# Patient Record
Sex: Male | Born: 1945 | Race: Black or African American | Hispanic: No | Marital: Married | Smoking: Never smoker
Health system: Southern US, Community
[De-identification: ages and names within clinical notes are randomized; demographics above are authoritative.]

## PROBLEM LIST (undated history)

## (undated) DIAGNOSIS — K219 Gastro-esophageal reflux disease without esophagitis: Secondary | ICD-10-CM

## (undated) DIAGNOSIS — I10 Essential (primary) hypertension: Secondary | ICD-10-CM

## (undated) DIAGNOSIS — H9319 Tinnitus, unspecified ear: Secondary | ICD-10-CM

## (undated) DIAGNOSIS — F0280 Dementia in other diseases classified elsewhere without behavioral disturbance: Secondary | ICD-10-CM

## (undated) DIAGNOSIS — E785 Hyperlipidemia, unspecified: Secondary | ICD-10-CM

---

## 2020-08-05 ENCOUNTER — Emergency Department (HOSPITAL_COMMUNITY): Payer: Medicare PPO

## 2020-08-05 ENCOUNTER — Other Ambulatory Visit: Payer: Self-pay

## 2020-08-05 ENCOUNTER — Inpatient Hospital Stay (HOSPITAL_COMMUNITY)
Admission: EM | Admit: 2020-08-05 | Discharge: 2020-08-15 | DRG: 380 | Disposition: A | Discharge status: Skilled Nursing Facility | Payer: Medicare PPO | Attending: Internal Medicine | Admitting: Internal Medicine

## 2020-08-05 ENCOUNTER — Inpatient Hospital Stay (HOSPITAL_COMMUNITY): Payer: Medicare PPO

## 2020-08-05 ENCOUNTER — Encounter (HOSPITAL_COMMUNITY): Payer: Self-pay | Admitting: *Deleted

## 2020-08-05 DIAGNOSIS — G9341 Metabolic encephalopathy: Secondary | ICD-10-CM | POA: Diagnosis not present

## 2020-08-05 DIAGNOSIS — F0280 Dementia in other diseases classified elsewhere without behavioral disturbance: Secondary | ICD-10-CM | POA: Diagnosis not present

## 2020-08-05 DIAGNOSIS — D892 Hypergammaglobulinemia, unspecified: Secondary | ICD-10-CM

## 2020-08-05 DIAGNOSIS — Z515 Encounter for palliative care: Secondary | ICD-10-CM | POA: Diagnosis not present

## 2020-08-05 DIAGNOSIS — R197 Diarrhea, unspecified: Secondary | ICD-10-CM | POA: Diagnosis not present

## 2020-08-05 DIAGNOSIS — N3001 Acute cystitis with hematuria: Secondary | ICD-10-CM | POA: Diagnosis present

## 2020-08-05 DIAGNOSIS — Z66 Do not resuscitate: Secondary | ICD-10-CM | POA: Diagnosis present

## 2020-08-05 DIAGNOSIS — I10 Essential (primary) hypertension: Secondary | ICD-10-CM

## 2020-08-05 DIAGNOSIS — K2100 Gastro-esophageal reflux disease with esophagitis, without bleeding: Secondary | ICD-10-CM | POA: Diagnosis present

## 2020-08-05 DIAGNOSIS — K209 Esophagitis, unspecified without bleeding: Secondary | ICD-10-CM | POA: Diagnosis not present

## 2020-08-05 DIAGNOSIS — Z87440 Personal history of urinary (tract) infections: Secondary | ICD-10-CM

## 2020-08-05 DIAGNOSIS — M4856XA Collapsed vertebra, not elsewhere classified, lumbar region, initial encounter for fracture: Secondary | ICD-10-CM | POA: Diagnosis present

## 2020-08-05 DIAGNOSIS — E870 Hyperosmolality and hypernatremia: Secondary | ICD-10-CM | POA: Diagnosis present

## 2020-08-05 DIAGNOSIS — Z886 Allergy status to analgesic agent status: Secondary | ICD-10-CM

## 2020-08-05 DIAGNOSIS — Z79899 Other long term (current) drug therapy: Secondary | ICD-10-CM | POA: Diagnosis not present

## 2020-08-05 DIAGNOSIS — K311 Adult hypertrophic pyloric stenosis: Secondary | ICD-10-CM | POA: Diagnosis present

## 2020-08-05 DIAGNOSIS — E7800 Pure hypercholesterolemia, unspecified: Secondary | ICD-10-CM | POA: Diagnosis present

## 2020-08-05 DIAGNOSIS — B9620 Unspecified Escherichia coli [E. coli] as the cause of diseases classified elsewhere: Secondary | ICD-10-CM | POA: Diagnosis present

## 2020-08-05 DIAGNOSIS — Z781 Physical restraint status: Secondary | ICD-10-CM | POA: Diagnosis not present

## 2020-08-05 DIAGNOSIS — Z20822 Contact with and (suspected) exposure to covid-19: Secondary | ICD-10-CM | POA: Diagnosis present

## 2020-08-05 DIAGNOSIS — R109 Unspecified abdominal pain: Secondary | ICD-10-CM | POA: Diagnosis present

## 2020-08-05 DIAGNOSIS — E876 Hypokalemia: Secondary | ICD-10-CM | POA: Diagnosis not present

## 2020-08-05 DIAGNOSIS — K219 Gastro-esophageal reflux disease without esophagitis: Secondary | ICD-10-CM

## 2020-08-05 DIAGNOSIS — F05 Delirium due to known physiological condition: Secondary | ICD-10-CM | POA: Diagnosis present

## 2020-08-05 DIAGNOSIS — E785 Hyperlipidemia, unspecified: Secondary | ICD-10-CM | POA: Diagnosis present

## 2020-08-05 DIAGNOSIS — Z91013 Allergy to seafood: Secondary | ICD-10-CM

## 2020-08-05 DIAGNOSIS — K2900 Acute gastritis without bleeding: Secondary | ICD-10-CM | POA: Diagnosis present

## 2020-08-05 DIAGNOSIS — R1110 Vomiting, unspecified: Secondary | ICD-10-CM

## 2020-08-05 DIAGNOSIS — I700 Atherosclerosis of aorta: Secondary | ICD-10-CM | POA: Diagnosis present

## 2020-08-05 DIAGNOSIS — Z0189 Encounter for other specified special examinations: Secondary | ICD-10-CM

## 2020-08-05 DIAGNOSIS — R0602 Shortness of breath: Secondary | ICD-10-CM

## 2020-08-05 DIAGNOSIS — G309 Alzheimer's disease, unspecified: Secondary | ICD-10-CM | POA: Diagnosis present

## 2020-08-05 DIAGNOSIS — R1310 Dysphagia, unspecified: Secondary | ICD-10-CM | POA: Diagnosis present

## 2020-08-05 DIAGNOSIS — Z4659 Encounter for fitting and adjustment of other gastrointestinal appliance and device: Secondary | ICD-10-CM

## 2020-08-05 HISTORY — DX: Hyperlipidemia, unspecified: E78.5

## 2020-08-05 HISTORY — DX: Tinnitus, unspecified ear: H93.19

## 2020-08-05 HISTORY — DX: Essential (primary) hypertension: I10

## 2020-08-05 HISTORY — DX: Dementia in other diseases classified elsewhere, unspecified severity, without behavioral disturbance, psychotic disturbance, mood disturbance, and anxiety: F02.80

## 2020-08-05 HISTORY — DX: Gastro-esophageal reflux disease without esophagitis: K21.9

## 2020-08-05 LAB — CBC
HCT: 39.5 % (ref 39.0–52.0)
Hemoglobin: 12.4 g/dL — ABNORMAL LOW (ref 13.0–17.0)
MCH: 25.3 pg — ABNORMAL LOW (ref 26.0–34.0)
MCHC: 31.4 g/dL (ref 30.0–36.0)
MCV: 80.6 fL (ref 80.0–100.0)
Platelets: 424 10*3/uL — ABNORMAL HIGH (ref 150–400)
RBC: 4.9 MIL/uL (ref 4.22–5.81)
RDW: 15 % (ref 11.5–15.5)
WBC: 11.5 10*3/uL — ABNORMAL HIGH (ref 4.0–10.5)
nRBC: 0 % (ref 0.0–0.2)

## 2020-08-05 LAB — URINALYSIS, ROUTINE W REFLEX MICROSCOPIC
Bilirubin Urine: NEGATIVE
Glucose, UA: NEGATIVE mg/dL
Ketones, ur: NEGATIVE mg/dL
Nitrite: NEGATIVE
Protein, ur: 30 mg/dL — AB
Specific Gravity, Urine: 1.029 (ref 1.005–1.030)
WBC, UA: >50 WBC/hpf — ABNORMAL HIGH (ref 0–5)
pH: 6 (ref 5.0–8.0)

## 2020-08-05 LAB — COMPREHENSIVE METABOLIC PANEL
ALT: 14 U/L (ref 0–44)
AST: 55 U/L — ABNORMAL HIGH (ref 15–41)
Albumin: 3.2 g/dL — ABNORMAL LOW (ref 3.5–5.0)
Alkaline Phosphatase: 88 U/L (ref 38–126)
Anion gap: 5 (ref 5–15)
BUN: 15 mg/dL (ref 8–23)
CO2: 34 mmol/L — ABNORMAL HIGH (ref 22–32)
Calcium: 9.6 mg/dL (ref 8.9–10.3)
Chloride: 107 mmol/L (ref 98–111)
Creatinine, Ser: 1 mg/dL (ref 0.61–1.24)
GFR, Estimated: >60 mL/min (ref >60)
Glucose, Bld: 131 mg/dL — ABNORMAL HIGH (ref 70–99)
Potassium: 5.1 mmol/L (ref 3.5–5.1)
Sodium: 146 mmol/L — ABNORMAL HIGH (ref 135–145)
Total Bilirubin: 1.7 mg/dL — ABNORMAL HIGH (ref 0.3–1.2)
Total Protein: >12 g/dL — ABNORMAL HIGH (ref 6.5–8.1)

## 2020-08-05 LAB — HEMOGLOBIN A1C
Hgb A1c MFr Bld: 6.6 % — ABNORMAL HIGH (ref 4.8–5.6)
Mean Plasma Glucose: 142.72 mg/dL

## 2020-08-05 LAB — LIPASE, BLOOD: Lipase: 40 U/L (ref 11–51)

## 2020-08-05 LAB — SARS CORONAVIRUS 2 (TAT 6-24 HRS): SARS Coronavirus 2: NEGATIVE

## 2020-08-05 MED ORDER — PANTOPRAZOLE SODIUM 40 MG IV SOLR
40.0000 mg | Freq: Two times a day (BID) | INTRAVENOUS | Status: DC
  Administered 2020-08-05 – 2020-08-06 (×3): 40 mg via INTRAVENOUS
  Filled 2020-08-05 (×2): qty 40

## 2020-08-05 MED ORDER — QUETIAPINE FUMARATE 50 MG PO TABS
50.0000 mg | ORAL_TABLET | Freq: Every day | ORAL | Status: DC
  Administered 2020-08-06: 50 mg via ORAL
  Filled 2020-08-05 (×3): qty 1

## 2020-08-05 MED ORDER — IOHEXOL 300 MG/ML  SOLN
100.0000 mL | Freq: Once | INTRAMUSCULAR | Status: AC | PRN
  Administered 2020-08-05: 100 mL via INTRAVENOUS

## 2020-08-05 MED ORDER — PANTOPRAZOLE SODIUM 40 MG IV SOLR
40.0000 mg | Freq: Once | INTRAVENOUS | Status: AC
  Administered 2020-08-05: 40 mg via INTRAVENOUS
  Filled 2020-08-05: qty 40

## 2020-08-05 MED ORDER — LORAZEPAM 2 MG/ML IJ SOLN
1.0000 mg | Freq: Once | INTRAMUSCULAR | Status: AC
  Administered 2020-08-05: 1 mg via INTRAVENOUS
  Filled 2020-08-05: qty 1

## 2020-08-05 MED ORDER — ACETAMINOPHEN 650 MG RE SUPP: 650.0000 mg | Freq: Four times a day (QID) | RECTAL | Status: DC | PRN

## 2020-08-05 MED ORDER — QUETIAPINE FUMARATE 25 MG PO TABS
37.5000 mg | ORAL_TABLET | Freq: Every day | ORAL | Status: DC
  Administered 2020-08-06 – 2020-08-07 (×2): 37.5 mg via ORAL
  Filled 2020-08-05 (×2): qty 1

## 2020-08-05 MED ORDER — ONDANSETRON HCL 4 MG PO TABS: 4.0000 mg | ORAL_TABLET | Freq: Four times a day (QID) | ORAL | Status: DC | PRN

## 2020-08-05 MED ORDER — ALBUTEROL SULFATE (2.5 MG/3ML) 0.083% IN NEBU: 2.5000 mg | INHALATION_SOLUTION | RESPIRATORY_TRACT | Status: DC | PRN

## 2020-08-05 MED ORDER — ONDANSETRON HCL 4 MG/2ML IJ SOLN: 4.0000 mg | Freq: Four times a day (QID) | INTRAMUSCULAR | Status: DC | PRN

## 2020-08-05 MED ORDER — ACETAMINOPHEN 325 MG PO TABS
650.0000 mg | ORAL_TABLET | Freq: Four times a day (QID) | ORAL | Status: DC | PRN
  Administered 2020-08-10: 650 mg via ORAL
  Filled 2020-08-05: qty 2

## 2020-08-05 MED ORDER — DEXTROSE-NACL 5-0.45 % IV SOLN: INTRAVENOUS | Status: DC

## 2020-08-05 MED ORDER — HALOPERIDOL LACTATE 5 MG/ML IJ SOLN
5.0000 mg | Freq: Once | INTRAMUSCULAR | Status: AC
  Administered 2020-08-05: 5 mg via INTRAVENOUS
  Filled 2020-08-05: qty 1

## 2020-08-05 MED ORDER — SODIUM CHLORIDE 0.9 % IV SOLN
1.0000 g | Freq: Once | INTRAVENOUS | Status: AC
  Administered 2020-08-05: 1 g via INTRAVENOUS
  Filled 2020-08-05: qty 10

## 2020-08-05 NOTE — ED Notes (Signed)
Again report called told "this patient is going to a different nurse you will need to call her."

## 2020-08-05 NOTE — ED Notes (Signed)
Patient transported to CT 

## 2020-08-05 NOTE — ED Notes (Addendum)
Report called to [facility]. Denied questions or concerns. Soft wrists restrained removed. Skin intact.

## 2020-08-05 NOTE — ED Notes (Signed)
Patient transported to X-ray 

## 2020-08-05 NOTE — H&P (Signed)
History and Physical  Patient Name: Blake Lutz     YJE:[id number]    DOB: 1945-08-28    DOA: 08/05/2020 PCP: Default, Provider, MD  Patient coming from: Nursing facility  Chief Complaint: Diarrhea    HPI: Blake Lutz is a 75 y.o. male, with PMH of Alzheimer's, GERD, hypertension, dyslipidemia, compression fracture, prostate cancer who presented to the ER on 08/05/2020 with diarrhea.  Due to patient's baseline Alzheimer's, dementia, unable to obtain complete and thorough HPI review of systems.  History obtained from chart and family.  Apparently since yesterday, patient has had multiple episodes of nonbloody diarrhea.  He also has had some nausea and vomiting.  His symptoms have been persistent.  Only GI history per wife is GERD.  He has only been at [facility], nursing facility, for 1 week.  He has a history of Alzheimer's and is nonverbal at baseline.  Paperwork from facility did not have any home medications.  Wife states he has a history of UTIs recently.  He was treated for UTI about a week ago and received 3 days of antibiotics.  Wife currently does not live in the area but is in the process of moving to the area. Due to his symptoms at the facility, he was transferred to the ER for evaluation.    ED course: -Vitals on admission: Afebrile, heart rate 93, respiratory rate 15, blood pressure 132/78, maintaining sats on room air. -Labs on initial presentation: Sodium 146, potassium 5.1, chloride 107, bicarb 34, glucose 133, BUN 15, creatinine 1, calcium 9.6, albumin 3.2, lipase 40, WBC 11.5, hemoglobin 12.4, platelets 424 -Imaging obtained on admission: Personally reviewed imaging which shows: CT abdomen pelvis demonstrated gastric outlet obstruction, moderately to severely dilated stomach with abrupt tapering and proximal duodenum, findings consistent with diarrhea, and diffusely thickened bladder wall thickening. -In the ED the patient was given Rocephin, Protonix.  GI consulted in  ED and the hospitalist service was contacted for further evaluation and management.     ROS: Unable to obtain due to patient's status.     Past Medical History:  Diagnosis Date  . Alzheimer disease (HCC)   . GERD (gastroesophageal reflux disease)   . Hyperlipidemia   . Hypertension   . Tinnitus       Social History: Patient lives at nursing facility.  Unsure ambulatory status.  Non-smoker  Allergies  Allergen Reactions  . Ibuprofen   . Shellfish Allergy     Family history: family history is not on file.  Prior to Admission medications   Not on File       Physical Exam: BP 138/85   Pulse 86   Temp 98.3 F (36.8 C) (Oral)   Resp 11   SpO2 97%   General appearance: Well-developed, adult male, alert and in no acute distress .   Eyes: Anicteric, conjunctiva pink, lids and lashes normal. PERRL.    ENT: No nasal deformity, discharge, epistaxis.  OP moist without lesions.   Neck: No neck masses.  Trachea midline.  No thyromegaly/tenderness. Lymph: No cervical or supraclavicular lymphadenopathy. Skin: Warm and dry.  No jaundice.  No suspicious rashes or lesions. Cardiac: RRR, nl S1-S2, no murmurs appreciated.  No LE edema.  Radial and pedal pulses 2+ and symmetric. Respiratory: Normal respiratory rate and rhythm.  CTAB without rales or wheezes. Abdomen: Abdomen soft.    Diffuse tenderness.   Bowel sounds present. MSK: No deformities or effusions of the large joints of the upper or lower extremities bilaterally.  No cyanosis  or clubbing. Neuro:  Unable to fully assess due to patient status.  Sensation intact to light touch. Non verbal Psych:  Unable to converse with.  Nonverbal.    Labs on Admission:  I have personally reviewed following labs and imaging studies: CBC: Recent Labs  Lab 08/05/20 0641  WBC 11.5*  HGB 12.4*  HCT 39.5  MCV 80.6  PLT 424*   Basic Metabolic Panel: Recent Labs  Lab 08/05/20 0641  NA 146*  K 5.1  CL 107  CO2 34*  GLUCOSE  131*  BUN 15  CREATININE 1.00  CALCIUM 9.6   GFR: CrCl cannot be calculated (Unknown ideal weight.).  Liver Function Tests: Recent Labs  Lab 08/05/20 0641  AST 55*  ALT 14  ALKPHOS 88  BILITOT 1.7*  PROT >12.0*  ALBUMIN 3.2*   Recent Labs  Lab 08/05/20 0641  LIPASE 40   No results for input(s): AMMONIA in the last 168 hours. Coagulation Profile: No results for input(s): INR, PROTIME in the last 168 hours. Cardiac Enzymes: No results for input(s): CKTOTAL, CKMB, CKMBINDEX, TROPONINI in the last 168 hours. BNP (last 3 results) No results for input(s): PROBNP in the last 8760 hours. HbA1C: No results for input(s): HGBA1C in the last 72 hours. CBG: No results for input(s): GLUCAP in the last 168 hours. Lipid Profile: No results for input(s): CHOL, HDL, LDLCALC, TRIG, CHOLHDL, LDLDIRECT in the last 72 hours. Thyroid Function Tests: No results for input(s): TSH, T4TOTAL, FREET4, T3FREE, THYROIDAB in the last 72 hours. Anemia Panel: No results for input(s): VITAMINB12, FOLATE, FERRITIN, TIBC, IRON, RETICCTPCT in the last 72 hours.   No results found for this or any previous visit (from the past 240 hour(s)).         Radiological Exams on Admission: Personally reviewed imaging which shows: CT abdomen pelvis demonstrated gastric outlet obstruction, moderately to severely dilated stomach with abrupt tapering and proximal duodenum, findings consistent with diarrhea, and diffusely thickened bladder wall thickening.  CT Abdomen Pelvis W Contrast  Result Date: 08/05/2020 CLINICAL DATA:  75 year old male with abdominal distension and pain. EXAM: CT ABDOMEN AND PELVIS WITH CONTRAST TECHNIQUE: Multidetector CT imaging of the abdomen and pelvis was performed using the standard protocol following bolus administration of intravenous contrast. CONTRAST:  OMNIPAQUE IOHEXOL 300 MG/ML  SOLN COMPARISON:  None. FINDINGS: Lower chest: Mild motion artifact and atelectasis. Mild  cardiomegaly. No pericardial effusion. Calcified coronary artery atherosclerosis. Hepatobiliary: Contracted gallbladder. Negative liver. No bile duct enlargement. Pancreas: Partially atrophied, otherwise negative. Spleen: Diminutive and negative; there is a tiny low-density area in the mid body of the spleen on series 4, image 28 which is too small to characterize but likely benign. Adrenals/Urinary Tract: Normal adrenal glands. Symmetric renal enhancement and contrast excretion. Benign appearing right renal posterior midpole cyst. Decompressed ureters. No urinary calculus identified. Diffusely thick-walled urinary bladder (series 4, image 81). No perivesical stranding. Bladder volume within normal limits. Stomach/Bowel: Fluid throughout nondilated large bowel to the rectum. Redundant hepatic flexure and right colon. Motion artifact in the upper abdomen. No discrete large bowel inflammation. Normal appendix on series 4, image 59. Decompressed terminal ileum with fluid-filled but nondilated distal small bowel in the lower abdomen. Decompressed proximal small bowel. Moderately to severely dilated gas and fluid containing stomach. Duodenal bulb similarly dilated with abrupt tapering at the distal bulb, proximal duodenum on coronal image 43. Questionable duodenal wall thickening there, but no perigastric or para duodenal inflammation. Distal duodenum and ligament of Treitz decompressed. No free  air.  No free fluid. Vascular/Lymphatic: Aortoiliac calcified atherosclerosis. Tortuous major arteries in the abdomen and pelvis which appear to remain patent. Portal venous system is patent. No lymphadenopathy. Reproductive: Sequelae of prostate brachytherapy. Other: No pelvic free fluid. Musculoskeletal: Chronic appearing L1 superior endplate compression fracture with associated sclerosis. Disc and endplate degeneration elsewhere. No acute osseous abnormality identified. IMPRESSION: 1. Gastric Outlet Obstruction: Moderately to  severely dilated stomach containing gas and fluid with abrupt tapering in the proximal duodenum. Questionable duodenal wall thickening, such that this might be related to inflammatory or postinflammatory stricture. But there is no regional inflammation. Recommend NG tube decompression. 2. Superimposed fluid throughout nondilated large bowel compatible with diarrhea. No discrete large bowel inflammation. Normal appendix. 3. Diffusely thick-walled urinary bladder suggesting cystitis or UTI. Nearby sequelae of prostate brachytherapy. 4. Chronic appearing L1 superior endplate compression fracture. 5. Aortic Atherosclerosis (ICD10-I70.0). Mild cardiomegaly. Coronary artery atherosclerosis. Electronically Signed   By: Odessa Fleming M.D.   On: 08/05/2020 10:17        Assessment/Plan   1.  Gastric outlet obstruction -CT abdomen pelvis with contrast on admission demonstrated gastric outlet obstruction, moderately to severely dilated stomach with abrupt tapering in the proximal duodenum -GI consulted in the ED -N.p.o.  -NG tube pending -Continue PPI Q12H -Antiemetics as warranted -Aspiration precautions -IV fluids -Strict I's and O's  2.  Diarrhea -Patient has had diarrhea for the past 24 hours.  History of recent antibiotic use -CT abdomen pelvis on admission demonstrated findings consistent with diarrhea -C. difficile testing ordered -IV fluids, 1/2 NS with D5W given slight hyponatremia  3.  Alzheimer's dementia -Mittens in place to avoid removal of lines and NG tube -Fall precautions -Currently n.p.o. due to gastric outlet obstruction  4.  Hypernatremia -On admission sodium 146 -Likely secondary to impaired free water intake and ongoing GI losses -Started on half-normal saline -Follow-up labs ordered  5.  Paraproteinemia -On admission total protein >12.  Unfortunately no previous labs to compare to -We will defer further work-up at this time    DVT prophylaxis: SCDs Code Status:  DNR Family Communication: Wife and daughter via telephone Disposition Plan: Anticipate discharge to nursing facility when medically optimized Consults called: GI consulted by ED Admission status: Inpatient     Medical decision making: Patient seen at 12:23 PM on 08/05/2020.  The patient was discussed with ER provider.  What exists of the patient's chart was reviewed in depth and summarized above.  Clinical condition: Watcher.        Laqueta Due [facility] Please page though AMION or Epic secure chat:  For password, contact charge nurse

## 2020-08-05 NOTE — ED Notes (Signed)
RN told in report that report has neem atte,pted since 1600 with no success attempting report.

## 2020-08-05 NOTE — ED Notes (Signed)
RN assuming care. Pt Pt resting in bed @ this time w/ NAD noted. VSS. Cardiac monitoring in place w/ bp cycling and spo2 being monitored. Remains in soft restraints. NG tube secured to R nostril.   Updated on plan of care. Deny needs or concerns @ this time. Bed low and locked.

## 2020-08-05 NOTE — Plan of Care (Signed)
  Problem: Safety: Goal: Non-violent Restraint(s) Outcome: Not Progressing  Pt trying to pull cortrak

## 2020-08-05 NOTE — Consult Note (Addendum)
Referring Provider:  EDP Primary Care Physician:  Default, Provider, MD Primary Gastroenterologist:  Gentry Fitz  Reason for Consultation:  GOO with duodenal wall thickening on CT  HPI: Blake Lutz is a 75 y.o. male with past medical history of advanced dementia, GERD, hypertension, hyperlipidemia.  He was brought in from nursing facility due to multiple episodes of diarrhea.  Patient is not able to provide history due to his dementia.  The admitting physician spoke with his wife.  Apparently he had multiple episodes of nonbloody diarrhea yesterday.  Has had some nausea and vomiting as well.  He has only been at the nursing facility for the past week, [facility].  He is nonverbal at baseline.  His wife apparently does not currently live in the area was in the process of moving here.  Due to his symptoms at the facility he was transferred to the ER.  He is hemodynamically stable.  White blood cell count 11.5.  CT scan of the abdomen and pelvis with contrast was obtained and showed the following:  IMPRESSION: 1. Gastric Outlet Obstruction: Moderately to severely dilated stomach containing gas and fluid with abrupt tapering in the proximal duodenum. Questionable duodenal wall thickening, such that this might be related to inflammatory or postinflammatory stricture. But there is no regional inflammation. Recommend NG tube decompression.  2. Superimposed fluid throughout nondilated large bowel compatible with diarrhea. No discrete large bowel inflammation. Normal appendix.  3. Diffusely thick-walled urinary bladder suggesting cystitis or UTI. Nearby sequelae of prostate brachytherapy.  4. Chronic appearing L1 superior endplate compression fracture.  5. Aortic Atherosclerosis (ICD10-I70.0). Mild cardiomegaly. Coronary artery atherosclerosis.  Only GI history reported by his wife is GERD.  Has apparently been treated for UTIs a lot recently.  Looks like he probably has a UTI here  as well and has been placed on Rocephin.  Past Medical History:  Diagnosis Date  . Alzheimer disease (HCC)   . GERD (gastroesophageal reflux disease)   . Hyperlipidemia   . Hypertension   . Tinnitus     Current Facility-Administered Medications  Medication Dose Route Frequency Provider Last Rate Last Admin  . acetaminophen (TYLENOL) tablet 650 mg  650 mg Oral Q6H PRN Laqueta Due, DO       Or  . acetaminophen (TYLENOL) suppository 650 mg  650 mg Rectal Q6H PRN MacNeil, Richard G, DO      . albuterol (PROVENTIL) (2.5 MG/3ML) 0.083% nebulizer solution 2.5 mg  2.5 mg Nebulization Q4H PRN MacNeil, Richard G, DO      . dextrose 5 %-0.45 % sodium chloride infusion   Intravenous Continuous MacNeil, Richard G, DO      . ondansetron (ZOFRAN) tablet 4 mg  4 mg Oral Q6H PRN MacNeil, Richard G, DO       Or  . ondansetron (ZOFRAN) injection 4 mg  4 mg Intravenous Q6H PRN MacNeil, Richard G, DO      . pantoprazole (PROTONIX) injection 40 mg  40 mg Intravenous Q12H MacNeil, Richard G, DO   40 mg at 08/05/20 1247  . [START ON 08/06/2020] QUEtiapine (SEROQUEL) tablet 37.5 mg  37.5 mg Oral Daily MacNeil, Richard G, DO      . QUEtiapine (SEROQUEL) tablet 50 mg  50 mg Oral QAC supper Laqueta Due, DO       Current Outpatient Medications  Medication Sig Dispense Refill  . famotidine (PEPCID) 40 MG tablet Take 40 mg by mouth daily.    . ferrous sulfate 325 (65 FE)  MG tablet Take 325 mg by mouth daily with breakfast.    . hydrochlorothiazide (MICROZIDE) 12.5 MG capsule Take 12.5 mg by mouth daily.    . magnesium hydroxide (MILK OF MAGNESIA) 400 MG/5ML suspension Take 30 mLs by mouth daily as needed for mild constipation.    . memantine (NAMENDA) 5 MG tablet Take 7.5 mg by mouth 2 (two) times daily. Take 1.5 tabs by mouth twice daily    . metoprolol tartrate (LOPRESSOR) 25 MG tablet Take 25 mg by mouth 2 (two) times daily.    . montelukast (SINGULAIR) 10 MG tablet Take 10 mg by mouth at bedtime.     . polyethylene glycol (MIRALAX / GLYCOLAX) 17 g packet Take 17 g by mouth See admin instructions. Mix 17g in beverage of choice and take by mouth    . potassium chloride 20 MEQ/15ML (10%) SOLN Take 20 mEq by mouth daily.    . pravastatin (PRAVACHOL) 40 MG tablet Take 40 mg by mouth at bedtime.    Marland Kitchen QUEtiapine (SEROQUEL) 25 MG tablet Take 37.5 mg by mouth See admin instructions. Take 1.5 tab (37.5 mg) by mouth every morning    . QUEtiapine (SEROQUEL) 25 MG tablet Take 50 mg by mouth See admin instructions. Take 2 tablets by mouth at 1700    . senna-docusate (SENOKOT-S) 8.6-50 MG tablet Take 2 tablets by mouth daily.      Allergies as of 08/05/2020 - Review Complete 08/05/2020  Allergen Reaction Noted  . Ibuprofen  08/05/2020  . Shellfish allergy  08/05/2020    No family history on file.  Social History   Socioeconomic History  . Marital status: Married    Spouse name: Not on file  . Number of children: Not on file  . Years of education: Not on file  . Highest education level: Not on file  Occupational History  . Not on file  Tobacco Use  . Smoking status: Not on file  . Smokeless tobacco: Not on file  Substance and Sexual Activity  . Alcohol use: Not on file  . Drug use: Not on file  . Sexual activity: Not on file  Other Topics Concern  . Not on file  Social History Narrative  . Not on file   Social Determinants of Health   Financial Resource Strain: Not on file  Food Insecurity: Not on file  Transportation Needs: Not on file  Physical Activity: Not on file  Stress: Not on file  Social Connections: Not on file  Intimate Partner Violence: Not on file    Review of Systems: ROS is O/W negative except as mentioned in HPI.  Physical Exam: Vital signs in last 24 hours: Temp:  [98.3 F (36.8 C)-98.8 F (37.1 C)] 98.3 F (36.8 C) (04/03 0704) Pulse Rate:  [85-94] 85 (04/03 1200) Resp:  [11-22] 19 (04/03 1200) BP: (131-154)/(72-93) 144/87 (04/03 1200) SpO2:  [96  %-100 %] 98 % (04/03 1200)   General:  Sleepy.  Will open his eyes if encouraged. Head:  Normocephalic and atraumatic. Eyes:  Sclera clear, no icterus.  Conjunctiva pink. Ears:  Normal auditory acuity. Mouth:  No deformity or lesions.   Lungs:  Clear throughout to auscultation.  No wheezes, crackles, or rhonchi.  Heart:  Regular rate and rhythm; no murmurs, clicks, rubs, or gallops. Abdomen:  Soft, non-distended.  BS present and somewhat hyperactive.  Non-tender.  Msk:  Symmetrical without gross deformities. Pulses:  Normal pulses noted. Extremities:  Without clubbing or edema. Skin:  Intact without  significant lesions or rashes.  Intake/Output from previous day: 04/02 0701 - 04/03 0700 In: 500 [I.V.:500] Out: -   Lab Results: Recent Labs    08/05/20 0641  WBC 11.5*  HGB 12.4*  HCT 39.5  PLT 424*   BMET Recent Labs    08/05/20 0641  NA 146*  K 5.1  CL 107  CO2 34*  GLUCOSE 131*  BUN 15  CREATININE 1.00  CALCIUM 9.6   LFT Recent Labs    08/05/20 0641  PROT >12.0*  ALBUMIN 3.2*  AST 55*  ALT 14  ALKPHOS 88  BILITOT 1.7*   Studies/Results: CT Abdomen Pelvis W Contrast  Result Date: 08/05/2020 CLINICAL DATA:  75 year old male with abdominal distension and pain. EXAM: CT ABDOMEN AND PELVIS WITH CONTRAST TECHNIQUE: Multidetector CT imaging of the abdomen and pelvis was performed using the standard protocol following bolus administration of intravenous contrast. CONTRAST:  OMNIPAQUE IOHEXOL 300 MG/ML  SOLN COMPARISON:  None. FINDINGS: Lower chest: Mild motion artifact and atelectasis. Mild cardiomegaly. No pericardial effusion. Calcified coronary artery atherosclerosis. Hepatobiliary: Contracted gallbladder. Negative liver. No bile duct enlargement. Pancreas: Partially atrophied, otherwise negative. Spleen: Diminutive and negative; there is a tiny low-density area in the mid body of the spleen on series 4, image 28 which is too small to characterize but likely  benign. Adrenals/Urinary Tract: Normal adrenal glands. Symmetric renal enhancement and contrast excretion. Benign appearing right renal posterior midpole cyst. Decompressed ureters. No urinary calculus identified. Diffusely thick-walled urinary bladder (series 4, image 81). No perivesical stranding. Bladder volume within normal limits. Stomach/Bowel: Fluid throughout nondilated large bowel to the rectum. Redundant hepatic flexure and right colon. Motion artifact in the upper abdomen. No discrete large bowel inflammation. Normal appendix on series 4, image 59. Decompressed terminal ileum with fluid-filled but nondilated distal small bowel in the lower abdomen. Decompressed proximal small bowel. Moderately to severely dilated gas and fluid containing stomach. Duodenal bulb similarly dilated with abrupt tapering at the distal bulb, proximal duodenum on coronal image 43. Questionable duodenal wall thickening there, but no perigastric or para duodenal inflammation. Distal duodenum and ligament of Treitz decompressed. No free air.  No free fluid. Vascular/Lymphatic: Aortoiliac calcified atherosclerosis. Tortuous major arteries in the abdomen and pelvis which appear to remain patent. Portal venous system is patent. No lymphadenopathy. Reproductive: Sequelae of prostate brachytherapy. Other: No pelvic free fluid. Musculoskeletal: Chronic appearing L1 superior endplate compression fracture with associated sclerosis. Disc and endplate degeneration elsewhere. No acute osseous abnormality identified. IMPRESSION: 1. Gastric Outlet Obstruction: Moderately to severely dilated stomach containing gas and fluid with abrupt tapering in the proximal duodenum. Questionable duodenal wall thickening, such that this might be related to inflammatory or postinflammatory stricture. But there is no regional inflammation. Recommend NG tube decompression. 2. Superimposed fluid throughout nondilated large bowel compatible with diarrhea. No  discrete large bowel inflammation. Normal appendix. 3. Diffusely thick-walled urinary bladder suggesting cystitis or UTI. Nearby sequelae of prostate brachytherapy. 4. Chronic appearing L1 superior endplate compression fracture. 5. Aortic Atherosclerosis (ICD10-I70.0). Mild cardiomegaly. Coronary artery atherosclerosis. Electronically Signed   By: Odessa Fleming M.D.   On: 08/05/2020 10:17   IMPRESSION:  *75 year old male with advanced dementia presenting from nursing facility with diarrhea for several days and then now more recently vomiting today.  CT scan shows gastric outlet obstruction with wall thickening of the duodenum either inflammatory or postinflammatory in nature. *Diarrhea:  Superimposed fluid throughout nondilated large bowel compatible with diarrhea. No discrete large bowel inflammation seen on CT scan.  Recent antibiotic use for UTI.  Had an episode of diarrhea in the ED.  PLAN: -Needs NG tube for decompression.  His nurse just contacted me and said that she is not able to get the NG tube in x2 attempts.  They will contact radiology to see if they can place it -Will order stool for Cdiff and GI pathogen panel. -Pantoprazole 40 mg IV twice daily. -We will likely need eventual EGD while he is here, but needs decompression first.   Princella PellegriniJessica D. Zehr  08/05/2020, 1:40 PM   Attending physician's note   I have reviewed the chart and examined the patient. I agree with the Advanced Practitioner's note, impression and recommendations.  Unable to take history due to dementia.  [age]yr old NH resident with advanced dementia. Adm d/t N/V/ ?diarrhea. CT AP showing gastric outlet obstruction with abrupt tapering of duodenum (inflammatory vs neoplastic) Mod-severe gastric distention with food residue/gastric secretions.  Plan: -NG tube for decompression. -IV Protonix -IVF -EGD in 2-3 days after gastric decompression and possibly gastric lavage. -Trend CBC, CMP. -Dr. Myrtie Neitheranis taking over the service  tomorrow.  Will discuss with him.  Edman Circleaj Emalina Dubreuil, MD [facility] [phone]

## 2020-08-05 NOTE — ED Notes (Signed)
Attempted to call report for a second time. Secretary stated they have not accepted the pt yet.

## 2020-08-05 NOTE — ED Provider Notes (Signed)
I personally evaluated the patient during the encounter and completed a history, physical, procedures, medical decision making to contribute to the overall care of the patient and decision making for the patient briefly, the patient is a 75 y.o. male with history of dementia, high cholesterol, hypertension who presents the ED with nausea, vomiting, diarrhea from nursing home.  Normal vitals.  No fever.  Overall pleasantly demented.  Some tenderness on abdominal exam but nonfocal.  Not able to provide much history.  We will get basic labs and a CT scan of his abdomen and pelvis.  Suspect possibly a viral process or foodborne illness.  Will rule out obstruction or other intra-abdominal infection.  Will check for electrolyte abnormalities.  Will give fluid bolus.  This chart was dictated using voice recognition software.  Despite best efforts to proofread,  errors can occur which can change the documentation meaning.     EKG Interpretation  Date/Time:  Sunday August 05 2020 06:43:43 EDT Ventricular Rate:  93 PR Interval:  141 QRS Duration: 85 QT Interval:  359 QTC Calculation: 447 R Axis:   -2 Text Interpretation: Sinus rhythm Multiform ventricular premature complexes Probable left atrial enlargement Confirmed by Marily Memos [phone]) on 08/05/2020 6:51:09 AM           Virgina Norfolk, DO 08/05/20 0240

## 2020-08-05 NOTE — ED Notes (Signed)
I attempted to place NG in pt twice, butt was unsuccessful both times it came out of the mouth. I had people hold pt down while I tried both times and pt still kept moving and yelling. I notified the GI dr and they said for pt to go to radiology to have it placed.

## 2020-08-05 NOTE — ED Provider Notes (Signed)
[facility] EMERGENCY DEPARTMENT Provider Note   CSN: [id number] Arrival date & time: 08/05/20  7341     History Chief Complaint  Patient presents with  . Abdominal Pain    Gorman Safi is a 75 y.o. male with PMH/o Alzheim'ers, GERD, HLD, HTN BIB EMS from [facility] for evaluation of diarrhea.  Nursing staff notes that yesterday, he had multiple episodes of nonbloody diarrhea.  He just came to the facility about a week ago.  They do not think he has been on any recent antibiotics but is unsure. When EMS had gotten there, he had been vomiting.   EM LEVEL 5 CAVEAT DUE TO ALZHEIMER'S   The history is provided by the nursing home.       Past Medical History:  Diagnosis Date  . Alzheimer disease (HCC)   . GERD (gastroesophageal reflux disease)   . Hyperlipidemia   . Hypertension   . Tinnitus     The histories are not reviewed yet. Please review them in the "History" navigator section and refresh this SmartLink.     No family history on file.     Home Medications Prior to Admission medications   Not on File    Allergies    Ibuprofen and Shellfish allergy  Review of Systems   Review of Systems  Unable to perform ROS: Dementia    Physical Exam Updated Vital Signs BP 138/85   Pulse 86   Temp 98.3 F (36.8 C) (Oral)   Resp 11   SpO2 97%   Physical Exam Vitals and nursing note reviewed.  Constitutional:      Appearance: Normal appearance. He is well-developed.  HENT:     Head: Normocephalic and atraumatic.  Eyes:     General: Lids are normal.     Conjunctiva/sclera: Conjunctivae normal.     Pupils: Pupils are equal, round, and reactive to light.  Cardiovascular:     Rate and Rhythm: Normal rate and regular rhythm.     Pulses: Normal pulses.     Heart sounds: Normal heart sounds. No murmur heard. No friction rub. No gallop.   Pulmonary:     Effort: Pulmonary effort is normal.     Breath sounds: Normal breath sounds.     Comments:  Lungs clear to auscultation bilaterally.  Symmetric chest rise.  No wheezing, rales, rhonchi. Abdominal:     General: There is distension.     Palpations: Abdomen is soft. Abdomen is not rigid.     Tenderness: There is no abdominal tenderness. There is no guarding.     Comments: Abdomen is slightly distended. No rigidity, guarding. No CVA tenderness.   Musculoskeletal:        General: Normal range of motion.     Cervical back: Full passive range of motion without pain.  Skin:    General: Skin is warm and dry.     Capillary Refill: Capillary refill takes less than 2 seconds.  Neurological:     Mental Status: He is alert.     Comments: Alert to name only (which is baseline per nursing home staff)  Psychiatric:        Speech: Speech normal.     ED Results / Procedures / Treatments   Labs (all labs ordered are listed, but only abnormal results are displayed) Labs Reviewed  COMPREHENSIVE METABOLIC PANEL - Abnormal; Notable for the following components:      Result Value   Sodium 146 (*)    CO2 34 (*)  Glucose, Bld 131 (*)    Total Protein >12.0 (*)    Albumin 3.2 (*)    AST 55 (*)    Total Bilirubin 1.7 (*)    All other components within normal limits  CBC - Abnormal; Notable for the following components:   WBC 11.5 (*)    Hemoglobin 12.4 (*)    MCH 25.3 (*)    Platelets 424 (*)    All other components within normal limits  URINALYSIS, ROUTINE W REFLEX MICROSCOPIC - Abnormal; Notable for the following components:   APPearance CLOUDY (*)    Hgb urine dipstick MODERATE (*)    Protein, ur 30 (*)    Leukocytes,Ua LARGE (*)    WBC, UA >50 (*)    Bacteria, UA RARE (*)    Non Squamous Epithelial 0-5 (*)    All other components within normal limits  URINE CULTURE  SARS CORONAVIRUS 2 (TAT 6-24 HRS)  LIPASE, BLOOD    EKG EKG Interpretation  Date/Time:  Sunday August 05 2020 06:43:43 EDT Ventricular Rate:  93 PR Interval:  141 QRS Duration: 85 QT Interval:  359 QTC  Calculation: 447 R Axis:   -2 Text Interpretation: Sinus rhythm Multiform ventricular premature complexes Probable left atrial enlargement Confirmed by Marily Memos [phone]) on 08/05/2020 6:51:09 AM   Radiology CT Abdomen Pelvis W Contrast  Result Date: 08/05/2020 CLINICAL DATA:  75 year old male with abdominal distension and pain. EXAM: CT ABDOMEN AND PELVIS WITH CONTRAST TECHNIQUE: Multidetector CT imaging of the abdomen and pelvis was performed using the standard protocol following bolus administration of intravenous contrast. CONTRAST:  OMNIPAQUE IOHEXOL 300 MG/ML  SOLN COMPARISON:  None. FINDINGS: Lower chest: Mild motion artifact and atelectasis. Mild cardiomegaly. No pericardial effusion. Calcified coronary artery atherosclerosis. Hepatobiliary: Contracted gallbladder. Negative liver. No bile duct enlargement. Pancreas: Partially atrophied, otherwise negative. Spleen: Diminutive and negative; there is a tiny low-density area in the mid body of the spleen on series 4, image 28 which is too small to characterize but likely benign. Adrenals/Urinary Tract: Normal adrenal glands. Symmetric renal enhancement and contrast excretion. Benign appearing right renal posterior midpole cyst. Decompressed ureters. No urinary calculus identified. Diffusely thick-walled urinary bladder (series 4, image 81). No perivesical stranding. Bladder volume within normal limits. Stomach/Bowel: Fluid throughout nondilated large bowel to the rectum. Redundant hepatic flexure and right colon. Motion artifact in the upper abdomen. No discrete large bowel inflammation. Normal appendix on series 4, image 59. Decompressed terminal ileum with fluid-filled but nondilated distal small bowel in the lower abdomen. Decompressed proximal small bowel. Moderately to severely dilated gas and fluid containing stomach. Duodenal bulb similarly dilated with abrupt tapering at the distal bulb, proximal duodenum on coronal image 43. Questionable  duodenal wall thickening there, but no perigastric or para duodenal inflammation. Distal duodenum and ligament of Treitz decompressed. No free air.  No free fluid. Vascular/Lymphatic: Aortoiliac calcified atherosclerosis. Tortuous major arteries in the abdomen and pelvis which appear to remain patent. Portal venous system is patent. No lymphadenopathy. Reproductive: Sequelae of prostate brachytherapy. Other: No pelvic free fluid. Musculoskeletal: Chronic appearing L1 superior endplate compression fracture with associated sclerosis. Disc and endplate degeneration elsewhere. No acute osseous abnormality identified. IMPRESSION: 1. Gastric Outlet Obstruction: Moderately to severely dilated stomach containing gas and fluid with abrupt tapering in the proximal duodenum. Questionable duodenal wall thickening, such that this might be related to inflammatory or postinflammatory stricture. But there is no regional inflammation. Recommend NG tube decompression. 2. Superimposed fluid throughout nondilated large bowel compatible  with diarrhea. No discrete large bowel inflammation. Normal appendix. 3. Diffusely thick-walled urinary bladder suggesting cystitis or UTI. Nearby sequelae of prostate brachytherapy. 4. Chronic appearing L1 superior endplate compression fracture. 5. Aortic Atherosclerosis (ICD10-I70.0). Mild cardiomegaly. Coronary artery atherosclerosis. Electronically Signed   By: Odessa Fleming M.D.   On: 08/05/2020 10:17    Procedures Procedures   Medications Ordered in ED Medications  cefTRIAXone (ROCEPHIN) 1 g in sodium chloride 0.9 % 100 mL IVPB (1 g Intravenous New Bag/Given 08/05/20 1211)  iohexol (OMNIPAQUE) 300 MG/ML solution 100 mL (100 mLs Intravenous Contrast Given 08/05/20 0946)  pantoprazole (PROTONIX) injection 40 mg (40 mg Intravenous Given 08/05/20 1206)    ED Course  I have reviewed the triage vital signs and the nursing notes.  Pertinent labs & imaging results that were available during my care of  the patient were reviewed by me and considered in my medical decision making (see chart for details).    MDM Rules/Calculators/A&P                          75 y.o. M BIB from EMS from [facility] for evaluation of diarrhea that began yesterday. He is confused at baseline. Patient is afebrile, non-toxic appearing, sitting comfortably on examination table. Abdomen is slightly distended. No overt tenderness but hard to tell given patient's baseline. Plan for labs, imaging.   7:15 AM: Discussed with Shaquita  at [facility].  Patient just arrived at their facility a week ago.  She reports that he reported diarrhea that began yesterday.  No blood.  She does not think he has been on any recent antibiotics.  Patient has a history of Alzheimer's and is normally confused at baseline.  She reports no changes in baseline.  CMP shows hypernatremia at 146, BUN and creatinine within normal limits.  CBC shows slight leukocytosis of 11.5.  Lipase is 40.  UA shows large leuks, pyuria, bacteria. Will plan to treat.   CT abdomen pelvis shows gastric outlet obstruction.  His stomach is moderately to severely dilated containing gas and fluid with abrupt tapering at the proximal duodenum.  There is questionable and duodenal thickening that could be inflammatory versus post inflammatory stricture.  Appendix is normal.  He is diffusely thick-walled urinary bladder.  Discussed patient with Victorino Dike ([facility]). She recommends NG tube for decompression.  Also recommends medical admission with GI to consult.  Recommends IV PPI 2 times a day.  Discussed patient with Dr. Marikay Alar (hospitalist) who accepts patient for admission.   Updated patient's family on plan.   Portions of this note were generated with Scientist, clinical (histocompatibility and immunogenetics). Dictation errors may occur despite best attempts at proofreading.   Final Clinical Impression(s) / ED Diagnoses Final diagnoses:  Gastric outlet obstruction  Acute cystitis with hematuria     Rx / DC Orders ED Discharge Orders    None       Rosana Hoes 08/05/20 1219    Virgina Norfolk, DO 08/05/20 1405

## 2020-08-05 NOTE — ED Triage Notes (Signed)
Pt from [facility] by EMS for NVD. Per report, hx of alzheimer's and pt nonverbal. EKG noted to have frequent PVCs. Pt denies pain, abd feels mildly distended

## 2020-08-05 NOTE — ED Notes (Addendum)
RN attempted to call report told "I dont know anything about it I will call you back."

## 2020-08-05 NOTE — ED Notes (Signed)
Placed pt on bed alarm, because when I went in his room, he was up walking around and ripped his IV out.

## 2020-08-05 NOTE — ED Notes (Signed)
Attempted to call report; secretary said they have not accepted the pt yet

## 2020-08-05 NOTE — ED Notes (Signed)
Pt found in room in bed with restraints torn off and leads off as well. Pt placed back into bed and leads back on

## 2020-08-05 NOTE — ED Notes (Signed)
Attempted to call report again; no answer

## 2020-08-06 ENCOUNTER — Inpatient Hospital Stay (HOSPITAL_COMMUNITY): Payer: Medicare PPO

## 2020-08-06 DIAGNOSIS — I10 Essential (primary) hypertension: Secondary | ICD-10-CM | POA: Diagnosis not present

## 2020-08-06 DIAGNOSIS — E870 Hyperosmolality and hypernatremia: Secondary | ICD-10-CM | POA: Diagnosis not present

## 2020-08-06 DIAGNOSIS — K311 Adult hypertrophic pyloric stenosis: Secondary | ICD-10-CM | POA: Diagnosis not present

## 2020-08-06 DIAGNOSIS — K219 Gastro-esophageal reflux disease without esophagitis: Secondary | ICD-10-CM | POA: Diagnosis not present

## 2020-08-06 LAB — CBC
HCT: 37.9 % — ABNORMAL LOW (ref 39.0–52.0)
Hemoglobin: 11.8 g/dL — ABNORMAL LOW (ref 13.0–17.0)
MCH: 25.4 pg — ABNORMAL LOW (ref 26.0–34.0)
MCHC: 31.1 g/dL (ref 30.0–36.0)
MCV: 81.7 fL (ref 80.0–100.0)
Platelets: 285 10*3/uL (ref 150–400)
RBC: 4.64 MIL/uL (ref 4.22–5.81)
RDW: 15.2 % (ref 11.5–15.5)
WBC: 9.6 10*3/uL (ref 4.0–10.5)
nRBC: 0 % (ref 0.0–0.2)

## 2020-08-06 LAB — COMPREHENSIVE METABOLIC PANEL
ALT: 12 U/L (ref 0–44)
AST: 16 U/L (ref 15–41)
Albumin: 3.1 g/dL — ABNORMAL LOW (ref 3.5–5.0)
Alkaline Phosphatase: 94 U/L (ref 38–126)
Anion gap: 8 (ref 5–15)
BUN: 12 mg/dL (ref 8–23)
CO2: 27 mmol/L (ref 22–32)
Calcium: 9.8 mg/dL (ref 8.9–10.3)
Chloride: 116 mmol/L — ABNORMAL HIGH (ref 98–111)
Creatinine, Ser: 0.99 mg/dL (ref 0.61–1.24)
GFR, Estimated: >60 mL/min (ref >60)
Glucose, Bld: 104 mg/dL — ABNORMAL HIGH (ref 70–99)
Potassium: 3.4 mmol/L — ABNORMAL LOW (ref 3.5–5.1)
Sodium: 151 mmol/L — ABNORMAL HIGH (ref 135–145)
Total Bilirubin: 0.7 mg/dL (ref 0.3–1.2)
Total Protein: 6.9 g/dL (ref 6.5–8.1)

## 2020-08-06 LAB — MAGNESIUM: Magnesium: 2.1 mg/dL (ref 1.7–2.4)

## 2020-08-06 LAB — PHOSPHORUS: Phosphorus: 3.2 mg/dL (ref 2.5–4.6)

## 2020-08-06 MED ORDER — PANTOPRAZOLE SODIUM 40 MG IV SOLR
40.0000 mg | INTRAVENOUS | Status: DC
  Administered 2020-08-06: 40 mg via INTRAVENOUS
  Filled 2020-08-06: qty 40

## 2020-08-06 MED ORDER — POTASSIUM CHLORIDE 10 MEQ/100ML IV SOLN
10.0000 meq | INTRAVENOUS | Status: AC
  Administered 2020-08-06 (×2): 10 meq via INTRAVENOUS
  Filled 2020-08-06 (×2): qty 100

## 2020-08-06 MED ORDER — LORAZEPAM 2 MG/ML IJ SOLN
1.0000 mg | Freq: Once | INTRAMUSCULAR | Status: AC
  Administered 2020-08-06: 1 mg via INTRAVENOUS
  Filled 2020-08-06: qty 1

## 2020-08-06 MED ORDER — DEXTROSE 5 % IV SOLN: INTRAVENOUS | Status: AC

## 2020-08-06 MED ORDER — HALOPERIDOL LACTATE 5 MG/ML IJ SOLN
4.0000 mg | Freq: Four times a day (QID) | INTRAMUSCULAR | Status: DC | PRN
  Administered 2020-08-06 – 2020-08-07 (×3): 4 mg via INTRAVENOUS
  Filled 2020-08-06 (×4): qty 1

## 2020-08-06 NOTE — H&P (View-Only) (Signed)
Daily Rounding Note  08/06/2020, 12:53 PM  LOS: 1 day   SUBJECTIVE:   Chief complaint: Gastric outlet obstruction  Patient removed the NG tube overnight.  No NG tube output has/had been recorded.  Today's RN said when she came in this morning the NG tube was out and there was no canister on the wall that had collected anything.  No bowel movements reported. Remains confused, agitated, delerious.  Got some Ativan and Haldol and eventually settled down. NG tube now replaced and waiting on results of x-ray before restarting suction. Patient denies abdominal pain.  He has not vomited.  Has not had a bowel movement.  OBJECTIVE:         Vital signs in last 24 hours:    Temp:  [97.8 F (36.6 C)-98 F (36.7 C)] 97.8 F (36.6 C) (04/04 0319) Pulse Rate:  [88-103] 100 (04/04 0319) Resp:  [14-15] 15 (04/04 0319) BP: (128-171)/(76-100) 148/88 (04/04 0319) SpO2:  [95 %-97 %] 97 % (04/04 0319)   There were no vitals filed for this visit. General: Comfortable, pleasant, mostly nonverbal. Heart: Tachycardic. Chest: No labored breathing.  No cough. Abdomen: Soft, nondistended.  Hyperactive bowel sounds but no high-pitched or tinkling bowel sounds. Extremities: No CCE Neuro/Psych: Confused.  Follows some commands and able to answer yes/no questions.  Moves all 4 limbs.  No tremors.    Intake/Output from previous day: No intake/output data recorded.  Intake/Output this shift: No intake/output data recorded.  Lab Results: Recent Labs    08/05/20 0641 08/06/20 0430  WBC 11.5* 9.6  HGB 12.4* 11.8*  HCT 39.5 37.9*  PLT 424* 285   BMET Recent Labs    08/05/20 0641 08/06/20 0430  NA 146* 151*  K 5.1 3.4*  CL 107 116*  CO2 34* 27  GLUCOSE 131* 104*  BUN 15 12  CREATININE 1.00 0.99  CALCIUM 9.6 9.8   LFT Recent Labs    08/05/20 0641 08/06/20 0430  PROT >12.0* 6.9  ALBUMIN 3.2* 3.1*  AST 55* 16  ALT 14 12  ALKPHOS  88 94  BILITOT 1.7* 0.7   PT/INR No results for input(s): LABPROT, INR in the last 72 hours. Hepatitis Panel No results for input(s): HEPBSAG, HCVAB, HEPAIGM, HEPBIGM in the last 72 hours.  Studies/Results: CT Abdomen Pelvis W Contrast  Result Date: 08/05/2020 CLINICAL DATA:  75 year old male with abdominal distension and pain. EXAM: CT ABDOMEN AND PELVIS WITH CONTRAST TECHNIQUE: Multidetector CT imaging of the abdomen and pelvis was performed using the standard protocol following bolus administration of intravenous contrast. CONTRAST:  OMNIPAQUE IOHEXOL 300 MG/ML  SOLN COMPARISON:  None. FINDINGS: Lower chest: Mild motion artifact and atelectasis. Mild cardiomegaly. No pericardial effusion. Calcified coronary artery atherosclerosis. Hepatobiliary: Contracted gallbladder. Negative liver. No bile duct enlargement. Pancreas: Partially atrophied, otherwise negative. Spleen: Diminutive and negative; there is a tiny low-density area in the mid body of the spleen on series 4, image 28 which is too small to characterize but likely benign. Adrenals/Urinary Tract: Normal adrenal glands. Symmetric renal enhancement and contrast excretion. Benign appearing right renal posterior midpole cyst. Decompressed ureters. No urinary calculus identified. Diffusely thick-walled urinary bladder (series 4, image 81). No perivesical stranding. Bladder volume within normal limits. Stomach/Bowel: Fluid throughout nondilated large bowel to the rectum. Redundant hepatic flexure and right colon. Motion artifact in the upper abdomen. No discrete large bowel inflammation. Normal appendix on series 4, image 59. Decompressed terminal ileum with fluid-filled but  nondilated distal small bowel in the lower abdomen. Decompressed proximal small bowel. Moderately to severely dilated gas and fluid containing stomach. Duodenal bulb similarly dilated with abrupt tapering at the distal bulb, proximal duodenum on coronal image 43. Questionable  duodenal wall thickening there, but no perigastric or para duodenal inflammation. Distal duodenum and ligament of Treitz decompressed. No free air.  No free fluid. Vascular/Lymphatic: Aortoiliac calcified atherosclerosis. Tortuous major arteries in the abdomen and pelvis which appear to remain patent. Portal venous system is patent. No lymphadenopathy. Reproductive: Sequelae of prostate brachytherapy. Other: No pelvic free fluid. Musculoskeletal: Chronic appearing L1 superior endplate compression fracture with associated sclerosis. Disc and endplate degeneration elsewhere. No acute osseous abnormality identified. IMPRESSION: 1. Gastric Outlet Obstruction: Moderately to severely dilated stomach containing gas and fluid with abrupt tapering in the proximal duodenum. Questionable duodenal wall thickening, such that this might be related to inflammatory or postinflammatory stricture. But there is no regional inflammation. Recommend NG tube decompression. 2. Superimposed fluid throughout nondilated large bowel compatible with diarrhea. No discrete large bowel inflammation. Normal appendix. 3. Diffusely thick-walled urinary bladder suggesting cystitis or UTI. Nearby sequelae of prostate brachytherapy. 4. Chronic appearing L1 superior endplate compression fracture. 5. Aortic Atherosclerosis (ICD10-I70.0). Mild cardiomegaly. Coronary artery atherosclerosis. Electronically Signed   By: H  Hall M.D.   On: 08/05/2020 10:17   DG Naso G Tube Plc W/Fl W/Rad  Result Date: 08/05/2020 CLINICAL DATA:  Gastric outflow obstruction. EXAM: NASO G TUBE PLACEMENT WITH FL AND WITH RAD FLUOROSCOPY TIME:  Fluoroscopy Time:  0.9 minutes Number of Acquired Spot Images: 0 COMPARISON:  None. FINDINGS: After numbing the nostril with lidocaine jelly, the NG tube was not certain through the nostril and advanced into the stomach with fluoroscopic guidance. IMPRESSION: Appropriate NG tube placement under fluoroscopic guidance. Electronically Signed    By: David  Williams Lutz M.D   On: 08/05/2020 18:44    ASSESMENT:   *    vomiting.  Gastric outlet obstruction.  CT confirming for duodenal wall thickening Unfortunately the patient is demented and removed his NG tube today. Protonix 40 mg iv/24h in place  *   Urinary bladder wall thickening, cystitis versus UTI.  Growing 100k E. coli from urine culture.     *    SNF patient with dementia.  Baseline mental and physical status not clear.   PLAN   *    Await reading of post NG tube x-ray.  *      EGD, ? timing.    Blake Lutz  08/06/2020, 12:53 PM Phone [phone]  I have discussed the case with the PA, and that is the plan I formulated. I personally interviewed and examined the patient.  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 NG tube is now replaced and hopefully will remain for decompression with patient physical and chemical restraint. Upper endoscopy next couple of days to determine cause of GOO and what treatment might be offered.  We will have to speak with his family and understand their goals of care for this patient.    Blake Lutz Office: [phone]  

## 2020-08-06 NOTE — Progress Notes (Addendum)
Daily Rounding Note  08/06/2020, 12:53 PM  LOS: 1 day   SUBJECTIVE:   Chief complaint: Gastric outlet obstruction  Patient removed the NG tube overnight.  No NG tube output has/had been recorded.  Today's RN said when she came in this morning the NG tube was out and there was no canister on the wall that had collected anything.  No bowel movements reported. Remains confused, agitated, delerious.  Got some Ativan and Haldol and eventually settled down. NG tube now replaced and waiting on results of x-ray before restarting suction. Patient denies abdominal pain.  He has not vomited.  Has not had a bowel movement.  OBJECTIVE:         Vital signs in last 24 hours:    Temp:  [97.8 F (36.6 C)-98 F (36.7 C)] 97.8 F (36.6 C) (04/04 0319) Pulse Rate:  [88-103] 100 (04/04 0319) Resp:  [14-15] 15 (04/04 0319) BP: (128-171)/(76-100) 148/88 (04/04 0319) SpO2:  [95 %-97 %] 97 % (04/04 0319)   There were no vitals filed for this visit. General: Comfortable, pleasant, mostly nonverbal. Heart: Tachycardic. Chest: No labored breathing.  No cough. Abdomen: Soft, nondistended.  Hyperactive bowel sounds but no high-pitched or tinkling bowel sounds. Extremities: No CCE Neuro/Psych: Confused.  Follows some commands and able to answer yes/no questions.  Moves all 4 limbs.  No tremors.    Intake/Output from previous day: No intake/output data recorded.  Intake/Output this shift: No intake/output data recorded.  Lab Results: Recent Labs    08/05/20 0641 08/06/20 0430  WBC 11.5* 9.6  HGB 12.4* 11.8*  HCT 39.5 37.9*  PLT 424* 285   BMET Recent Labs    08/05/20 0641 08/06/20 0430  NA 146* 151*  K 5.1 3.4*  CL 107 116*  CO2 34* 27  GLUCOSE 131* 104*  BUN 15 12  CREATININE 1.00 0.99  CALCIUM 9.6 9.8   LFT Recent Labs    08/05/20 0641 08/06/20 0430  PROT >12.0* 6.9  ALBUMIN 3.2* 3.1*  AST 55* 16  ALT 14 12  ALKPHOS  88 94  BILITOT 1.7* 0.7   PT/INR No results for input(s): LABPROT, INR in the last 72 hours. Hepatitis Panel No results for input(s): HEPBSAG, HCVAB, HEPAIGM, HEPBIGM in the last 72 hours.  Studies/Results: CT Abdomen Pelvis W Contrast  Result Date: 08/05/2020 CLINICAL DATA:  75 year old male with abdominal distension and pain. EXAM: CT ABDOMEN AND PELVIS WITH CONTRAST TECHNIQUE: Multidetector CT imaging of the abdomen and pelvis was performed using the standard protocol following bolus administration of intravenous contrast. CONTRAST:  OMNIPAQUE IOHEXOL 300 MG/ML  SOLN COMPARISON:  None. FINDINGS: Lower chest: Mild motion artifact and atelectasis. Mild cardiomegaly. No pericardial effusion. Calcified coronary artery atherosclerosis. Hepatobiliary: Contracted gallbladder. Negative liver. No bile duct enlargement. Pancreas: Partially atrophied, otherwise negative. Spleen: Diminutive and negative; there is a tiny low-density area in the mid body of the spleen on series 4, image 28 which is too small to characterize but likely benign. Adrenals/Urinary Tract: Normal adrenal glands. Symmetric renal enhancement and contrast excretion. Benign appearing right renal posterior midpole cyst. Decompressed ureters. No urinary calculus identified. Diffusely thick-walled urinary bladder (series 4, image 81). No perivesical stranding. Bladder volume within normal limits. Stomach/Bowel: Fluid throughout nondilated large bowel to the rectum. Redundant hepatic flexure and right colon. Motion artifact in the upper abdomen. No discrete large bowel inflammation. Normal appendix on series 4, image 59. Decompressed terminal ileum with fluid-filled but  nondilated distal small bowel in the lower abdomen. Decompressed proximal small bowel. Moderately to severely dilated gas and fluid containing stomach. Duodenal bulb similarly dilated with abrupt tapering at the distal bulb, proximal duodenum on coronal image 43. Questionable  duodenal wall thickening there, but no perigastric or para duodenal inflammation. Distal duodenum and ligament of Treitz decompressed. No free air.  No free fluid. Vascular/Lymphatic: Aortoiliac calcified atherosclerosis. Tortuous major arteries in the abdomen and pelvis which appear to remain patent. Portal venous system is patent. No lymphadenopathy. Reproductive: Sequelae of prostate brachytherapy. Other: No pelvic free fluid. Musculoskeletal: Chronic appearing L1 superior endplate compression fracture with associated sclerosis. Disc and endplate degeneration elsewhere. No acute osseous abnormality identified. IMPRESSION: 1. Gastric Outlet Obstruction: Moderately to severely dilated stomach containing gas and fluid with abrupt tapering in the proximal duodenum. Questionable duodenal wall thickening, such that this might be related to inflammatory or postinflammatory stricture. But there is no regional inflammation. Recommend NG tube decompression. 2. Superimposed fluid throughout nondilated large bowel compatible with diarrhea. No discrete large bowel inflammation. Normal appendix. 3. Diffusely thick-walled urinary bladder suggesting cystitis or UTI. Nearby sequelae of prostate brachytherapy. 4. Chronic appearing L1 superior endplate compression fracture. 5. Aortic Atherosclerosis (ICD10-I70.0). Mild cardiomegaly. Coronary artery atherosclerosis. Electronically Signed   By: Odessa Fleming M.D.   On: 08/05/2020 10:17   DG Basil Dess Tube Plc W/Fl W/Rad  Result Date: 08/05/2020 CLINICAL DATA:  Gastric outflow obstruction. EXAM: NASO G TUBE PLACEMENT WITH FL AND WITH RAD FLUOROSCOPY TIME:  Fluoroscopy Time:  0.9 minutes Number of Acquired Spot Images: 0 COMPARISON:  None. FINDINGS: After numbing the nostril with lidocaine jelly, the NG tube was not certain through the nostril and advanced into the stomach with fluoroscopic guidance. IMPRESSION: Appropriate NG tube placement under fluoroscopic guidance. Electronically Signed    By: Gerome Sam III M.D   On: 08/05/2020 18:44    ASSESMENT:   *    vomiting.  Gastric outlet obstruction.  CT confirming for duodenal wall thickening Unfortunately the patient is demented and removed his NG tube today. Protonix 40 mg iv/24h in place  *   Urinary bladder wall thickening, cystitis versus UTI.  Growing 100k E. coli from urine culture.     *    SNF patient with dementia.  Baseline mental and physical status not clear.   PLAN   *    Await reading of post NG tube x-ray.  *      EGD, ? timing.    Jennye Moccasin  08/06/2020, 12:53 PM Phone [phone]  I have discussed the case with the PA, and that is the plan I formulated. I personally interviewed and examined the patient.  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 NG tube is now replaced and hopefully will remain for decompression with patient physical and chemical restraint. Upper endoscopy next couple of days to determine cause of GOO and what treatment might be offered.  We will have to speak with his family and understand their goals of care for this patient.    Charlie Pitter III Office: [phone]

## 2020-08-06 NOTE — Progress Notes (Signed)
PROGRESS NOTE        PATIENT DETAILS Name: Blake Lutz Age: 75 y.o. Sex: male Date of Birth: 1946-04-13 Admit Date: 08/05/2020 Admitting Physician Laqueta Due, DO DJS:HFWYOVZ, Provider, MD  Brief Narrative: Patient is a 75 y.o. male dementia, HTN, HLD-who presented with vomiting-further evaluation revealed gastric outlet obstruction.  Significant events: 4/3>> admit from SNF-vomiting-found to have gastric outlet obstruction.  Significant studies: 4/3>> CT abdomen/pelvis: Gastric outlet obstruction.  Antimicrobial therapy: Rocephin: 4/3 x 1  Microbiology data: 4/3>> urine culture: Pending  Procedures : None  Consults: GI  DVT Prophylaxis : SCDs Start: 08/05/20 1220   Subjective: Per nursing staff-no further diarrhea.  Patient pulled out NG tube this morning.  Patient remains confused this morning-but during my evaluation-able to answer a few questions appropriately.   Assessment/Plan: Gastric outlet obstruction: Unclear etiology-concern for neoplasm at this age.  Will reattempt to place NG tube-but it will be challenging given patient's dementia/delirium-we will try with Haldol/Ativan.  Await further recommendations from GI.  Hypernatremia: Continue D5W-recheck electrolytes tomorrow  Hypokalemia: Replete and recheck  HTN: All oral antihypertensives on hold-as patient n.p.o.-we will start as needed IV hydralazine.  HLD: Statins on hold  Dementia with delirium: Continue Seroquel-at risk for delirium during this hospital stay.  Maintain delirium precautions.  Elevated total protein level on admission: Likely a lab error-protein level this morning within normal limits this morning   Diet: Diet Order            Diet NPO time specified  Diet effective now                  Code Status:  DNR  Family Communication: Spoke with spouse-Blake Lutz-[phone] over phone  Disposition Plan: Status is: Inpatient  Remains  inpatient appropriate because:Inpatient level of care appropriate due to severity of illness   Dispo: The patient is from: SNF              Anticipated d/c is to: SNF              Patient currently is not medically stable to d/c.   Difficult to place patient No    Barriers to Discharge: Ongoing gastric outlet obstruction-awaiting EGD.  Antimicrobial agents: Anti-infectives (From admission, onward)   Start     Dose/Rate Route Frequency Ordered Stop   08/05/20 1145  cefTRIAXone (ROCEPHIN) 1 g in sodium chloride 0.9 % 100 mL IVPB        1 g 200 mL/hr over 30 Minutes Intravenous  Once 08/05/20 1138 08/05/20 1247       Time spent: 25- minutes-Greater than 50% of this time was spent in counseling, explanation of diagnosis, planning of further management, and coordination of care.  MEDICATIONS: Scheduled Meds: . pantoprazole (PROTONIX) IV  40 mg Intravenous Q12H  . QUEtiapine  37.5 mg Oral Daily  . QUEtiapine  50 mg Oral QAC supper   Continuous Infusions: . dextrose 5 % and 0.45% NaCl 100 mL/hr at 08/05/20 2232   PRN Meds:.acetaminophen **OR** acetaminophen, albuterol, haloperidol lactate, ondansetron **OR** ondansetron (ZOFRAN) IV   PHYSICAL EXAM: Vital signs: Vitals:   08/05/20 1615 08/05/20 1636 08/05/20 1925 08/06/20 0319  BP: (!) 171/90 (!) 156/96 (!) 150/90 (!) 148/88  Pulse:  88 (!) 103 100  Resp:  15 14 15   Temp:  98 F (36.7 C) 97.8 F (36.6 C)  97.8 F (36.6 C)  TempSrc:  Oral Oral Oral  SpO2:  96% 97% 97%   There were no vitals filed for this visit. There is no height or weight on file to calculate BMI.   Gen Exam: Confused-not in any distress HEENT:atraumatic, normocephalic Chest: B/L clear to auscultation anteriorly CVS:S1S2 regular Abdomen:soft non tender, non distended Extremities:no edema Neurology: Non focal Skin: no rash  I have personally reviewed following labs and imaging studies  LABORATORY DATA: CBC: Recent Labs  Lab 08/05/20 0641  08/06/20 0430  WBC 11.5* 9.6  HGB 12.4* 11.8*  HCT 39.5 37.9*  MCV 80.6 81.7  PLT 424* 285    Basic Metabolic Panel: Recent Labs  Lab 08/05/20 0641 08/06/20 0430  NA 146* 151*  K 5.1 3.4*  CL 107 116*  CO2 34* 27  GLUCOSE 131* 104*  BUN 15 12  CREATININE 1.00 0.99  CALCIUM 9.6 9.8  MG  --  2.1  PHOS  --  3.2    GFR: CrCl cannot be calculated (Unknown ideal weight.).  Liver Function Tests: Recent Labs  Lab 08/05/20 0641 08/06/20 0430  AST 55* 16  ALT 14 12  ALKPHOS 88 94  BILITOT 1.7* 0.7  PROT >12.0* 6.9  ALBUMIN 3.2* 3.1*   Recent Labs  Lab 08/05/20 0641  LIPASE 40   No results for input(s): AMMONIA in the last 168 hours.  Coagulation Profile: No results for input(s): INR, PROTIME in the last 168 hours.  Cardiac Enzymes: No results for input(s): CKTOTAL, CKMB, CKMBINDEX, TROPONINI in the last 168 hours.  BNP (last 3 results) No results for input(s): PROBNP in the last 8760 hours.  Lipid Profile: No results for input(s): CHOL, HDL, LDLCALC, TRIG, CHOLHDL, LDLDIRECT in the last 72 hours.  Thyroid Function Tests: No results for input(s): TSH, T4TOTAL, FREET4, T3FREE, THYROIDAB in the last 72 hours.  Anemia Panel: No results for input(s): VITAMINB12, FOLATE, FERRITIN, TIBC, IRON, RETICCTPCT in the last 72 hours.  Urine analysis:    Component Value Date/Time   COLORURINE YELLOW 08/05/2020 1034   APPEARANCEUR CLOUDY (A) 08/05/2020 1034   LABSPEC 1.029 08/05/2020 1034   PHURINE 6.0 08/05/2020 1034   GLUCOSEU NEGATIVE 08/05/2020 1034   HGBUR MODERATE (A) 08/05/2020 1034   BILIRUBINUR NEGATIVE 08/05/2020 1034   KETONESUR NEGATIVE 08/05/2020 1034   PROTEINUR 30 (A) 08/05/2020 1034   NITRITE NEGATIVE 08/05/2020 1034   LEUKOCYTESUR LARGE (A) 08/05/2020 1034    Sepsis Labs: Lactic Acid, Venous No results found for: LATICACIDVEN  MICROBIOLOGY: Recent Results (from the past 240 hour(s))  SARS CORONAVIRUS 2 (TAT 6-24 HRS) Nasopharyngeal  Nasopharyngeal Swab     Status: None   Collection Time: 08/05/20 12:04 PM   Specimen: Nasopharyngeal Swab  Result Value Ref Range Status   SARS Coronavirus 2 NEGATIVE NEGATIVE Final    Comment: (NOTE) SARS-CoV-2 target nucleic acids are NOT DETECTED.  The SARS-CoV-2 RNA is generally detectable in upper and lower respiratory specimens during the acute phase of infection. Negative results do not preclude SARS-CoV-2 infection, do not rule out co-infections with other pathogens, and should not be used as the sole basis for treatment or other patient management decisions. Negative results must be combined with clinical observations, patient history, and epidemiological information. The expected result is Negative.  Fact Sheet for Patients: HairSlick.no  Fact Sheet for Healthcare Providers: quierodirigir.com  This test is not yet approved or cleared by the [place] FDA and  has been authorized for detection and/or diagnosis of SARS-CoV-2  by FDA under an Emergency Use Authorization (EUA). This EUA will remain  in effect (meaning this test can be used) for the duration of the COVID-19 declaration under Se ction 564(b)(1) of the Act, 21 U.S.C. section 360bbb-3(b)(1), unless the authorization is terminated or revoked sooner.  Performed at [facility], [address] [place], [place] [address]     RADIOLOGY STUDIES/RESULTS: CT Abdomen Pelvis W Contrast  Result Date: 08/05/2020 CLINICAL DATA:  75 year old male with abdominal distension and pain. EXAM: CT ABDOMEN AND PELVIS WITH CONTRAST TECHNIQUE: Multidetector CT imaging of the abdomen and pelvis was performed using the standard protocol following bolus administration of intravenous contrast. CONTRAST:  100mL OMNIPAQUE IOHEXOL 300 MG/ML  SOLN COMPARISON:  None. FINDINGS: Lower chest: Mild motion artifact and atelectasis. Mild cardiomegaly. No pericardial effusion. Calcified  coronary artery atherosclerosis. Hepatobiliary: Contracted gallbladder. Negative liver. No bile duct enlargement. Pancreas: Partially atrophied, otherwise negative. Spleen: Diminutive and negative; there is a tiny low-density area in the mid body of the spleen on series 4, image 28 which is too small to characterize but likely benign. Adrenals/Urinary Tract: Normal adrenal glands. Symmetric renal enhancement and contrast excretion. Benign appearing right renal posterior midpole cyst. Decompressed ureters. No urinary calculus identified. Diffusely thick-walled urinary bladder (series 4, image 81). No perivesical stranding. Bladder volume within normal limits. Stomach/Bowel: Fluid throughout nondilated large bowel to the rectum. Redundant hepatic flexure and right colon. Motion artifact in the upper abdomen. No discrete large bowel inflammation. Normal appendix on series 4, image 59. Decompressed terminal ileum with fluid-filled but nondilated distal small bowel in the lower abdomen. Decompressed proximal small bowel. Moderately to severely dilated gas and fluid containing stomach. Duodenal bulb similarly dilated with abrupt tapering at the distal bulb, proximal duodenum on coronal image 43. Questionable duodenal wall thickening there, but no perigastric or para duodenal inflammation. Distal duodenum and ligament of Treitz decompressed. No free air.  No free fluid. Vascular/Lymphatic: Aortoiliac calcified atherosclerosis. Tortuous major arteries in the abdomen and pelvis which appear to remain patent. Portal venous system is patent. No lymphadenopathy. Reproductive: Sequelae of prostate brachytherapy. Other: No pelvic free fluid. Musculoskeletal: Chronic appearing L1 superior endplate compression fracture with associated sclerosis. Disc and endplate degeneration elsewhere. No acute osseous abnormality identified. IMPRESSION: 1. Gastric Outlet Obstruction: Moderately to severely dilated stomach containing gas and fluid  with abrupt tapering in the proximal duodenum. Questionable duodenal wall thickening, such that this might be related to inflammatory or postinflammatory stricture. But there is no regional inflammation. Recommend NG tube decompression. 2. Superimposed fluid throughout nondilated large bowel compatible with diarrhea. No discrete large bowel inflammation. Normal appendix. 3. Diffusely thick-walled urinary bladder suggesting cystitis or UTI. Nearby sequelae of prostate brachytherapy. 4. Chronic appearing L1 superior endplate compression fracture. 5. Aortic Atherosclerosis (ICD10-I70.0). Mild cardiomegaly. Coronary artery atherosclerosis. Electronically Signed   By: Odessa FlemingH  Hall M.D.   On: 08/05/2020 10:17   DG Basil Dessaso G Tube Plc W/Fl W/Rad  Result Date: 08/05/2020 CLINICAL DATA:  Gastric outflow obstruction. EXAM: NASO G TUBE PLACEMENT WITH FL AND WITH RAD FLUOROSCOPY TIME:  Fluoroscopy Time:  0.9 minutes Number of Acquired Spot Images: 0 COMPARISON:  None. FINDINGS: After numbing the nostril with lidocaine jelly, the NG tube was not certain through the nostril and advanced into the stomach with fluoroscopic guidance. IMPRESSION: Appropriate NG tube placement under fluoroscopic guidance. Electronically Signed   By: Gerome Samavid  Williams III M.D   On: 08/05/2020 18:44     LOS: 1 day   Bonney Berres  Nakyia Dau, MD  [facility]    To contact the attending provider between 7A-7P or the covering provider during after hours 7P-7A, please log into the web site [email or web address] and access using universal [place] password for that web site. If you do not have the password, please call the hospital operator.  08/06/2020, 11:36 AM

## 2020-08-07 ENCOUNTER — Inpatient Hospital Stay (HOSPITAL_COMMUNITY): Payer: Medicare PPO | Admitting: Certified Registered Nurse Anesthetist

## 2020-08-07 ENCOUNTER — Encounter (HOSPITAL_COMMUNITY)
Admission: EM | Disposition: A | Discharge status: Skilled Nursing Facility | Payer: Self-pay | Source: Home / Self Care | Attending: Internal Medicine

## 2020-08-07 ENCOUNTER — Inpatient Hospital Stay (HOSPITAL_COMMUNITY): Payer: Medicare PPO

## 2020-08-07 ENCOUNTER — Encounter (HOSPITAL_COMMUNITY): Payer: Self-pay | Admitting: Internal Medicine

## 2020-08-07 DIAGNOSIS — E870 Hyperosmolality and hypernatremia: Secondary | ICD-10-CM | POA: Diagnosis not present

## 2020-08-07 DIAGNOSIS — K311 Adult hypertrophic pyloric stenosis: Secondary | ICD-10-CM | POA: Diagnosis not present

## 2020-08-07 DIAGNOSIS — K219 Gastro-esophageal reflux disease without esophagitis: Secondary | ICD-10-CM | POA: Diagnosis not present

## 2020-08-07 DIAGNOSIS — K209 Esophagitis, unspecified without bleeding: Secondary | ICD-10-CM

## 2020-08-07 DIAGNOSIS — K2900 Acute gastritis without bleeding: Secondary | ICD-10-CM

## 2020-08-07 DIAGNOSIS — I10 Essential (primary) hypertension: Secondary | ICD-10-CM | POA: Diagnosis not present

## 2020-08-07 HISTORY — PX: ESOPHAGOGASTRODUODENOSCOPY (EGD) WITH PROPOFOL: SHX5813

## 2020-08-07 LAB — CBC
HCT: 39.4 % (ref 39.0–52.0)
Hemoglobin: 12.3 g/dL — ABNORMAL LOW (ref 13.0–17.0)
MCH: 25.6 pg — ABNORMAL LOW (ref 26.0–34.0)
MCHC: 31.2 g/dL (ref 30.0–36.0)
MCV: 81.9 fL (ref 80.0–100.0)
Platelets: 296 10*3/uL (ref 150–400)
RBC: 4.81 MIL/uL (ref 4.22–5.81)
RDW: 15.1 % (ref 11.5–15.5)
WBC: 6.8 10*3/uL (ref 4.0–10.5)
nRBC: 0 % (ref 0.0–0.2)

## 2020-08-07 LAB — COMPREHENSIVE METABOLIC PANEL
ALT: 14 U/L (ref 0–44)
AST: 18 U/L (ref 15–41)
Albumin: 3.2 g/dL — ABNORMAL LOW (ref 3.5–5.0)
Alkaline Phosphatase: 92 U/L (ref 38–126)
Anion gap: 7 (ref 5–15)
BUN: 7 mg/dL — ABNORMAL LOW (ref 8–23)
CO2: 26 mmol/L (ref 22–32)
Calcium: 10.2 mg/dL (ref 8.9–10.3)
Chloride: 118 mmol/L — ABNORMAL HIGH (ref 98–111)
Creatinine, Ser: 0.93 mg/dL (ref 0.61–1.24)
GFR, Estimated: >60 mL/min (ref >60)
Glucose, Bld: 140 mg/dL — ABNORMAL HIGH (ref 70–99)
Potassium: 3 mmol/L — ABNORMAL LOW (ref 3.5–5.1)
Sodium: 151 mmol/L — ABNORMAL HIGH (ref 135–145)
Total Bilirubin: 0.9 mg/dL (ref 0.3–1.2)
Total Protein: 7.2 g/dL (ref 6.5–8.1)

## 2020-08-07 LAB — URINE CULTURE: Culture: >=100000 — AB

## 2020-08-07 SURGERY — ESOPHAGOGASTRODUODENOSCOPY (EGD) WITH PROPOFOL
Anesthesia: Monitor Anesthesia Care

## 2020-08-07 MED ORDER — DEXTROSE 5 % IV SOLN: INTRAVENOUS | Status: DC

## 2020-08-07 MED ORDER — METOPROLOL TARTRATE 25 MG PO TABS
25.0000 mg | ORAL_TABLET | Freq: Two times a day (BID) | ORAL | Status: DC
  Administered 2020-08-07 – 2020-08-15 (×18): 25 mg via ORAL
  Filled 2020-08-07 (×18): qty 1

## 2020-08-07 MED ORDER — PROPOFOL 500 MG/50ML IV EMUL
INTRAVENOUS | Status: DC | PRN
  Administered 2020-08-07: 25 ug/kg/min via INTRAVENOUS

## 2020-08-07 MED ORDER — PANTOPRAZOLE SODIUM 40 MG PO TBEC
40.0000 mg | DELAYED_RELEASE_TABLET | Freq: Every day | ORAL | Status: DC
  Administered 2020-08-07 – 2020-08-15 (×9): 40 mg via ORAL
  Filled 2020-08-07 (×9): qty 1

## 2020-08-07 MED ORDER — MONTELUKAST SODIUM 10 MG PO TABS
10.0000 mg | ORAL_TABLET | Freq: Every day | ORAL | Status: DC
  Administered 2020-08-07 – 2020-08-15 (×9): 10 mg via ORAL
  Filled 2020-08-07 (×9): qty 1

## 2020-08-07 MED ORDER — POTASSIUM CHLORIDE 20 MEQ PO PACK
40.0000 meq | PACK | ORAL | Status: AC
  Administered 2020-08-07 (×2): 40 meq via ORAL
  Filled 2020-08-07 (×2): qty 2

## 2020-08-07 MED ORDER — PROPOFOL 10 MG/ML IV BOLUS
INTRAVENOUS | Status: DC | PRN
  Administered 2020-08-07: 20 mg via INTRAVENOUS
  Administered 2020-08-07 (×2): 10 mg via INTRAVENOUS

## 2020-08-07 MED ORDER — LIDOCAINE 2% (20 MG/ML) 5 ML SYRINGE
INTRAMUSCULAR | Status: DC | PRN
  Administered 2020-08-07: 60 mg via INTRAVENOUS

## 2020-08-07 MED ORDER — LACTATED RINGERS IV SOLN: INTRAVENOUS | Status: DC | PRN

## 2020-08-07 MED ORDER — PRAVASTATIN SODIUM 40 MG PO TABS
40.0000 mg | ORAL_TABLET | Freq: Every day | ORAL | Status: DC
  Administered 2020-08-07 – 2020-08-15 (×9): 40 mg via ORAL
  Filled 2020-08-07 (×9): qty 1

## 2020-08-07 MED ORDER — MEMANTINE HCL 5 MG PO TABS
7.5000 mg | ORAL_TABLET | Freq: Two times a day (BID) | ORAL | Status: DC
  Administered 2020-08-07 – 2020-08-15 (×16): 7.5 mg via ORAL
  Filled 2020-08-07 (×17): qty 2

## 2020-08-07 SURGICAL SUPPLY — 15 items

## 2020-08-07 NOTE — Op Note (Signed)
[facility] Patient Name: Blake Lutz Procedure Date : 08/07/2020 MRN: [id number] Attending MD: Starr Lake. Myrtie Neither , MD Date of Birth: 1945/10/31 CSN: [id number] Age: 75 Admit Type: Inpatient Procedure:                Upper GI endoscopy Indications:              Suspected gastric outlet obstruction, Vomiting (CT                            showing apparent GOO with concern for duodenal                            process as cause) Providers:                Starr Lake. Myrtie Neither, MD, Dayton Bailiff, RN, Arlee Muslim Tech., Technician Referring MD:             [facility] Medicines:                Monitored Anesthesia Care Complications:            No immediate complications. Estimated Blood Loss:     Estimated blood loss: none. Procedure:                Pre-Anesthesia Assessment:                           - Prior to the procedure, a History and Physical                            was performed, and patient medications and                            allergies were reviewed. The patient's tolerance of                            previous anesthesia was also reviewed. The risks                            and benefits of the procedure and the sedation                            options and risks were discussed with the patient.                            All questions were answered, and informed consent                            was obtained. Prior Anticoagulants: The patient has                            taken no previous anticoagulant or antiplatelet  agents. ASA Grade Assessment: IV - A patient with                            severe systemic disease that is a constant threat                            to life. After reviewing the risks and benefits,                            the patient was deemed in satisfactory condition to                            undergo the procedure.                           After obtaining  informed consent, the endoscope was                            passed under direct vision. Throughout the                            procedure, the patient's blood pressure, pulse, and                            oxygen saturations were monitored continuously. The                            GIF-H190 ([id number] Olympus gastroscope was                            introduced through the mouth, and advanced to the                            fourth part of duodenum. The upper GI endoscopy was                            accomplished without difficulty. The patient                            tolerated the procedure fairly well. Scope In: Scope Out: Findings:      LA Grade B (one or more mucosal breaks greater than 5 mm, not extending       between the tops of two mucosal folds) esophagitis with no bleeding was       found in the lower third of the esophagus. Most likely acute from       vomiting and NGT trauma.      Scattered moderate inflammation characterized by erythema and friability       was found in the gastric body and in the gastric antrum (NGT trauma with       scant fresh blood cleared for good visualization.      The exam of the stomach was otherwise normal. (no motility was observed       during the procedure). Pylorus patent.      The examined duodenum was normal. Specifically,  no stricture,       inflammation or neoplasia seen to the 4th portion. Impression:               - LA Grade B esophagitis.                           - Acute gastritis.                           - Normal examined duodenum.                           - No specimens collected.                           This patient cannot give Hx due to advanced                            dementia. Nursing home reports are a week or less                            of diarrhea and some degree of vomiting. Diarrhea                            appears to have resolved.                           Overall clinical picture favors acute  viral                            infectious process, which can cause a self-limited                            gastroparesis-like symptom and imaging picture. He                            is on no medicines likely to cause delayed gastric                            emptying. Reportedly on recent Abx for UTI.                           Given advanced, dementia and medical complexity, I                            recommend avoiding metoclopramide unless this                            patient demonstrates chronic symptoms of                            gastroparesis. Recommendation:           - Soft diet.                           - Check with patieint's  nursing facility to see if                            he is on thickened liquids or other dietary                            modifications.                           - Return patient to hospital ward for ongoing care.                           - Use Protonix (pantoprazole) 40 mg PO daily for 4                            weeks.                           - Keep head of bed elevated to at least 30 degrees                            at all times.                           - Do not replace NGT.                           Wife/daughter updated by phone. Procedure Code(s):        --- Professional ---                           [phone], Esophagogastroduodenoscopy, flexible,                            transoral; diagnostic, including collection of                            specimen(s) by brushing or washing, when performed                            (separate procedure) Diagnosis Code(s):        --- Professional ---                           K20.90, Esophagitis, unspecified without bleeding                           K29.00, Acute gastritis without bleeding                           R11.10, Vomiting, unspecified CPT copyright 2019 American Medical Association. All rights reserved. The codes documented in this report are preliminary and upon coder  review may  be revised to meet current compliance requirements. Detrell Umscheid L. Myrtie Neither, MD 08/07/2020 10:55:47 AM This report has been signed electronically. Number of Addenda: 0

## 2020-08-07 NOTE — Plan of Care (Signed)
  Problem: Safety: Goal: Non-violent Restraint(s) Outcome: Progressing   Problem: Pain Managment: Goal: General experience of comfort will improve Outcome: Progressing   Problem: Safety: Goal: Ability to remain free from injury will improve Outcome: Progressing

## 2020-08-07 NOTE — Progress Notes (Signed)
Pt pulled NG tube. RN reinserted. KUB ordered.

## 2020-08-07 NOTE — Plan of Care (Signed)
  Problem: Safety: Goal: Non-violent Restraint(s) Outcome: Progressing   

## 2020-08-07 NOTE — Anesthesia Preprocedure Evaluation (Addendum)
Anesthesia Evaluation  Patient identified by MRN, date of birth, ID band Patient awake    Reviewed: Allergy & Precautions, NPO status , Patient's Chart, lab work & pertinent test results, reviewed documented beta blocker date and time   History of Anesthesia Complications Negative for: history of anesthetic complications  Airway Mallampati: II  TM Distance: >3 FB Neck ROM: Full    Dental  (+) Teeth Intact, Dental Advisory Given   Pulmonary COPD,  08/05/2020 SARS coronavirus NEG   breath sounds clear to auscultation       Cardiovascular hypertension, Pt. on medications and Pt. on home beta blockers (-) angina Rhythm:Regular Rate:Normal     Neuro/Psych PSYCHIATRIC DISORDERS (Alzheimer) Dementia negative neurological ROS     GI/Hepatic Neg liver ROS, GERD  Medicated,Gastric outlet issue: no n/v   Endo/Other  negative endocrine ROS  Renal/GU negative Renal ROS     Musculoskeletal   Abdominal   Peds  Hematology negative hematology ROS (+)   Anesthesia Other Findings   Reproductive/Obstetrics                            Anesthesia Physical Anesthesia Plan  ASA: III  Anesthesia Plan: MAC   Post-op Pain Management:    Induction:   PONV Risk Score and Plan: 1 and Treatment may vary due to age or medical condition  Airway Management Planned: Natural Airway and Nasal Cannula  Additional Equipment: None  Intra-op Plan:   Post-operative Plan:   Informed Consent: I have reviewed the patients History and Physical, chart, labs and discussed the procedure including the risks, benefits and alternatives for the proposed anesthesia with the patient or authorized representative who has indicated his/her understanding and acceptance.   Patient has DNR.  Suspend DNR and Discussed DNR with power of attorney.   Dental advisory given and Consent reviewed with POA  Plan Discussed with: CRNA and  Surgeon  Anesthesia Plan Comments: (Discussed with pt's wife and daughter by telephone)       Anesthesia Quick Evaluation

## 2020-08-07 NOTE — Anesthesia Postprocedure Evaluation (Signed)
Anesthesia Post Note  Patient: Blake Lutz  Procedure(s) Performed: ESOPHAGOGASTRODUODENOSCOPY (EGD) WITH PROPOFOL (N/A )     Patient location during evaluation: Endoscopy Anesthesia Type: MAC Level of consciousness: awake and alert and patient cooperative Pain management: pain level controlled Vital Signs Assessment: post-procedure vital signs reviewed and stable Respiratory status: nonlabored ventilation, spontaneous breathing and respiratory function stable Cardiovascular status: blood pressure returned to baseline Postop Assessment: no apparent nausea or vomiting Anesthetic complications: no   No complications documented.  Last Vitals:  Vitals:   08/07/20 1046 08/07/20 1138  BP: (!) 148/68 (!) 149/82  Pulse: 94 89  Resp: 19 20  Temp:  37 C  SpO2: 100% 99%    Last Pain:  Vitals:   08/07/20 1036  TempSrc: Temporal                 Kingston Shawgo,E. Dorthey Depace

## 2020-08-07 NOTE — Transfer of Care (Signed)
Immediate Anesthesia Transfer of Care Note  Patient: Blake Lutz  Procedure(s) Performed: ESOPHAGOGASTRODUODENOSCOPY (EGD) WITH PROPOFOL (N/A )  Patient Location: Endoscopy Unit  Anesthesia Type:MAC  Level of Consciousness: awake, alert  and patient cooperative  Airway & Oxygen Therapy: Patient Spontanous Breathing  Post-op Assessment: Report given to RN and Post -op Vital signs reviewed and stable  Post vital signs: Reviewed and stable  Last Vitals:  Vitals Value Taken Time  BP 140/68 08/07/20 1036  Temp    Pulse 93 08/07/20 1037  Resp 18 08/07/20 1037  SpO2 100 % 08/07/20 1037  Vitals shown include unvalidated device data.  Last Pain:  Vitals:   08/07/20 0917  TempSrc: Tympanic         Complications: No complications documented.

## 2020-08-07 NOTE — Interval H&P Note (Signed)
History and Physical Interval Note:  08/07/2020 10:09 AM  Blake Lutz  has presented today for surgery, with the diagnosis of GOO.  The various methods of treatment have been discussed with the patient and family. After consideration of risks, benefits and other options for treatment, the patient has consented to  Procedure(s): ESOPHAGOGASTRODUODENOSCOPY (EGD) WITH PROPOFOL (N/A) as a surgical intervention.  The patient's history has been reviewed, patient examined, no change in status, stable for surgery.  I have reviewed the patient's chart and labs.  Questions were answered to the patient's satisfaction.    Wife gave phone consent for EGD today. NGT is out again.  Charlie Pitter III

## 2020-08-07 NOTE — Progress Notes (Signed)
PROGRESS NOTE        PATIENT DETAILS Name: Blake Lutz Age: 75 y.o. Sex: male Date of Birth: [birth year] Admit Date: 08/05/2020 Admitting Physician Laqueta Due, DO WUJ:[id number], Provider, MD  Brief Narrative: Patient is a 75 y.o. male dementia, HTN, HLD-who presented with vomiting-further evaluation revealed gastric outlet obstruction.  Significant events: 4/3>> admit from SNF-vomiting-found to have gastric outlet obstruction.  Significant studies: 4/3>> CT abdomen/pelvis: Gastric outlet obstruction.  Antimicrobial therapy: Rocephin: 4/3 x 1  Microbiology data: 4/3>> urine culture: Pending  Procedures : 4/5>> EGD: Grade B esophagitis-from vomiting/NG trauma, gastritis, pylorus patent-no neoplasia/inflammation/stricture seen  Consults: GI  DVT Prophylaxis : SCDs Start: 08/05/20 1220   Subjective: Pulled NG tube out x2 last night-underwent EGD this morning.   Assessment/Plan: Gastric outlet obstruction: Resolved-EGD without any tumor-pylorus was patent.  Suspicion that patient may have had limited inflammation from a viral illness-that may have showed up as gastric outlet obstruction on CT imaging.  Starting diet-follow closely-obtain SLP eval.  Esophagitis gastritis: Seen on EGD-thought to be due to either vomiting or NG trauma-starting PPI.  Hypernatremia: Resume D5W-recheck electrolytes tomorrow.   Hypokalemia: Replete and recheck  HTN: BP creeping up-resume beta-blocker-continue to hold HCTZ given hypernatremia.   HLD: Resume statin  Dementia with delirium: Continues to be confused-unclear what his usual baseline is-resume Namenda-remains on Seroquel.  Continue to maintain delirium precautions.    Elevated total protein level on admission: Likely a lab error-protein level this morning within normal limits this morning   Diet: Diet Order            DIET SOFT Room service appropriate? Yes; Fluid consistency: Thin  Diet  effective now                  Code Status:  DNR  Family Communication: Spoke with spouse-[phone] over phone on 4/5  Disposition Plan: Status is: Inpatient  Remains inpatient appropriate because:Inpatient level of care appropriate due to severity of illness   Dispo: The patient is from: SNF              Anticipated d/c is to: SNF              Patient currently is not medically stable to d/c.   Difficult to place patient No    Barriers to Discharge: Advancing diet-correcting hypernatremia/hypokalemia-await PT/OT eval.  Antimicrobial agents: Anti-infectives (From admission, onward)   Start     Dose/Rate Route Frequency Ordered Stop   08/05/20 1145  cefTRIAXone (ROCEPHIN) 1 g in sodium chloride 0.9 % 100 mL IVPB        1 g 200 mL/hr over 30 Minutes Intravenous  Once 08/05/20 1138 08/05/20 1247       Time spent: 25- minutes-Greater than 50% of this time was spent in counseling, explanation of diagnosis, planning of further management, and coordination of care.  MEDICATIONS: Scheduled Meds: . pantoprazole  40 mg Oral QAC breakfast  . QUEtiapine  37.5 mg Oral Daily  . QUEtiapine  50 mg Oral QAC supper   Continuous Infusions:  PRN Meds:.acetaminophen **OR** acetaminophen, albuterol, haloperidol lactate, ondansetron **OR** ondansetron (ZOFRAN) IV   PHYSICAL EXAM: Vital signs: Vitals:   08/07/20 0917 08/07/20 1036 08/07/20 1046 08/07/20 1138  BP: (!) 167/81 140/68 (!) 148/68 (!) 149/82  Pulse:  97 94 89  Resp: 20 19 19 20   Temp: )  97.2 F (36.2 C) 97.9 F (36.6 C)  98.6 F (37 C)  TempSrc: Tympanic Temporal    SpO2: 95% 100% 100% 99%   There were no vitals filed for this visit. There is no height or weight on file to calculate BMI.   Gen Exam: Confused-but not in any distress. HEENT:atraumatic, normocephalic Chest: B/L clear to auscultation anteriorly CVS:S1S2 regular Abdomen:soft non tender, non distended Extremities:no edema Neurology:  Non focal Skin: no rash  I have personally reviewed following labs and imaging studies  LABORATORY DATA: CBC: Recent Labs  Lab 08/05/20 0641 08/06/20 0430 08/07/20 0608  WBC 11.5* 9.6 6.8  HGB 12.4* 11.8* 12.3*  HCT 39.5 37.9* 39.4  MCV 80.6 81.7 81.9  PLT 424* 285 296    Basic Metabolic Panel: Recent Labs  Lab 08/05/20 0641 08/06/20 0430 08/07/20 0608  NA 146* 151* 151*  K 5.1 3.4* 3.0*  CL 107 116* 118*  CO2 34* 27 26  GLUCOSE 131* 104* 140*  BUN 15 12 7*  CREATININE 1.00 0.99 0.93  CALCIUM 9.6 9.8 10.2  MG  --  2.1  --   PHOS  --  3.2  --     GFR: CrCl cannot be calculated (Unknown ideal weight.).  Liver Function Tests: Recent Labs  Lab 08/05/20 0641 08/06/20 0430 08/07/20 0608  AST 55* 16 18  ALT 14 12 14   ALKPHOS 88 94 92  BILITOT 1.7* 0.7 0.9  PROT >12.0* 6.9 7.2  ALBUMIN 3.2* 3.1* 3.2*   Recent Labs  Lab 08/05/20 0641  LIPASE 40   No results for input(s): AMMONIA in the last 168 hours.  Coagulation Profile: No results for input(s): INR, PROTIME in the last 168 hours.  Cardiac Enzymes: No results for input(s): CKTOTAL, CKMB, CKMBINDEX, TROPONINI in the last 168 hours.  BNP (last 3 results) No results for input(s): PROBNP in the last 8760 hours.  Lipid Profile: No results for input(s): CHOL, HDL, LDLCALC, TRIG, CHOLHDL, LDLDIRECT in the last 72 hours.  Thyroid Function Tests: No results for input(s): TSH, T4TOTAL, FREET4, T3FREE, THYROIDAB in the last 72 hours.  Anemia Panel: No results for input(s): VITAMINB12, FOLATE, FERRITIN, TIBC, IRON, RETICCTPCT in the last 72 hours.  Urine analysis:    Component Value Date/Time   COLORURINE YELLOW 08/05/2020 1034   APPEARANCEUR CLOUDY (A) 08/05/2020 1034   LABSPEC 1.029 08/05/2020 1034   PHURINE 6.0 08/05/2020 1034   GLUCOSEU NEGATIVE 08/05/2020 1034   HGBUR MODERATE (A) 08/05/2020 1034   BILIRUBINUR NEGATIVE 08/05/2020 1034   KETONESUR NEGATIVE 08/05/2020 1034   PROTEINUR 30 (A)  08/05/2020 1034   NITRITE NEGATIVE 08/05/2020 1034   LEUKOCYTESUR LARGE (A) 08/05/2020 1034    Sepsis Labs: Lactic Acid, Venous No results found for: LATICACIDVEN  MICROBIOLOGY: Recent Results (from the past 240 hour(s))  Urine culture     Status: Abnormal   Collection Time: 08/05/20 10:34 AM   Specimen: Urine, Random  Result Value Ref Range Status   Specimen Description URINE, RANDOM  Final   Special Requests   Final    NONE Performed at [facility], [address] [place], [place]    Culture >=100,000 COLONIES/mL ESCHERICHIA COLI (A)  Final   Report Status 08/07/2020 FINAL  Final   Organism ID, Bacteria ESCHERICHIA COLI (A)  Final      Susceptibility   Escherichia coli - MIC*    AMPICILLIN 8 SENSITIVE Sensitive     CEFAZOLIN <=4 SENSITIVE Sensitive  CEFEPIME <=0.12 SENSITIVE Sensitive     CEFTRIAXONE <=0.25 SENSITIVE Sensitive     CIPROFLOXACIN <=0.25 SENSITIVE Sensitive     GENTAMICIN <=1 SENSITIVE Sensitive     IMIPENEM <=0.25 SENSITIVE Sensitive     NITROFURANTOIN <=16 SENSITIVE Sensitive     TRIMETH/SULFA <=20 SENSITIVE Sensitive     AMPICILLIN/SULBACTAM <=2 SENSITIVE Sensitive     PIP/TAZO <=4 SENSITIVE Sensitive     * >=100,000 COLONIES/mL ESCHERICHIA COLI  SARS CORONAVIRUS 2 (TAT 6-24 HRS) Nasopharyngeal Nasopharyngeal Swab     Status: None   Collection Time: 08/05/20 12:04 PM   Specimen: Nasopharyngeal Swab  Result Value Ref Range Status   SARS Coronavirus 2 NEGATIVE NEGATIVE Final    Comment: (NOTE) SARS-CoV-2 target nucleic acids are NOT DETECTED.  The SARS-CoV-2 RNA is generally detectable in upper and lower respiratory specimens during the acute phase of infection. Negative results do not preclude SARS-CoV-2 infection, do not rule out co-infections with other pathogens, and should not be used as the sole basis for treatment or other patient management decisions. Negative results must be combined with clinical observations, patient  history, and epidemiological information. The expected result is Negative.  Fact Sheet for Patients: HairSlick.nohttps://www.fda.gov/media/138098/download  Fact Sheet for Healthcare Providers: quierodirigir.comhttps://www.fda.gov/media/138095/download  This test is not yet approved or cleared by the [place] FDA and  has been authorized for detection and/or diagnosis of SARS-CoV-2 by FDA under an Emergency Use Authorization (EUA). This EUA will remain  in effect (meaning this test can be used) for the duration of the COVID-19 declaration under Se ction 564(b)(1) of the Act, 21 U.S.C. section 360bbb-3(b)(1), unless the authorization is terminated or revoked sooner.  Performed at [facility], [address] [place], [place] [address]     RADIOLOGY STUDIES/RESULTS: DG Abd 1 View  Result Date: 08/07/2020 CLINICAL DATA:  NG tube placement. EXAM: ABDOMEN - 1 VIEW COMPARISON:  Prior study same day. FINDINGS: NG tube has been withdrawn and is noted with its tip over the upper chest. NG tube tip could be and the thoracic esophagus or right mainstem bronchus. Repositioning suggested. Nondistended air-filled loops of small bowel noted. No free air. Degenerative changes scoliosis thoracic spine. IMPRESSION: NG tube has been withdrawn and is noted with its tip over the upper chest. NG tube tip could be in the thoracic esophagus or right mainstem bronchus. Repositioning suggested. These results will be called to the ordering clinician or representative by the Radiologist Assistant, and communication documented in the PACS or [employer] Dashboard. Electronically Signed   By: Maisie Fushomas  Register   On: 08/07/2020 06:20   DG Abd 1 View  Result Date: 08/07/2020 CLINICAL DATA:  NG tube placement EXAM: ABDOMEN - 1 VIEW COMPARISON:  08/06/2020 FINDINGS: An enteric tube is present with tip in the left upper quadrant consistent with location in the upper stomach. Proximal side hole is at or just below the expected level of the EG  junction. No change in position since previous study. Visualized bowel gas pattern is normal. IMPRESSION: Enteric tube tip is in the left upper quadrant, likely in the upper stomach. Proximal side hole is at or just below the expected level of the GE junction. Electronically Signed   By: Burman NievesWilliam  Stevens M.D.   On: 08/07/2020 02:22   DG Abd Portable 1V  Result Date: 08/06/2020 CLINICAL DATA:  Nasogastric tube placement EXAM: PORTABLE ABDOMEN - 1 VIEW COMPARISON:  Portable exam 1317 hours without priors for comparison FINDINGS: Tip of nasogastric tube projects over  proximal stomach. Nonobstructive bowel gas pattern. Osseous structures significant only for scattered degenerative disc disease changes. No definite urinary tract calcifications. IMPRESSION: Tip of nasogastric tube projects over proximal stomach. Electronically Signed   By: Ulyses Southward M.D.   On: 08/06/2020 15:07   DG Naso G Tube Plc W/Fl W/Rad  Result Date: 08/05/2020 CLINICAL DATA:  Gastric outflow obstruction. EXAM: NASO G TUBE PLACEMENT WITH FL AND WITH RAD FLUOROSCOPY TIME:  Fluoroscopy Time:  0.9 minutes Number of Acquired Spot Images: 0 COMPARISON:  None. FINDINGS: After numbing the nostril with lidocaine jelly, the NG tube was not certain through the nostril and advanced into the stomach with fluoroscopic guidance. IMPRESSION: Appropriate NG tube placement under fluoroscopic guidance. Electronically Signed   By: Gerome Sam III M.D   On: 08/05/2020 18:44     LOS: 2 days   Jeoffrey Massed, MD  [facility]    To contact the attending provider between 7A-7P or the covering provider during after hours 7P-7A, please log into the web site [email or web address] and access using universal [place] password for that web site. If you do not have the password, please call the hospital operator.  08/07/2020, 1:47 PM

## 2020-08-07 NOTE — Progress Notes (Signed)
2 attempts at NG tube unsuccessful. Removed from pt. Obtained telephone consent from [place], vairifed by 2 nurses.

## 2020-08-07 NOTE — Evaluation (Signed)
Physical Therapy Evaluation Patient Details Name: Blake Lutz MRN: [id number] DOB: 09-Apr-1946 Today's Date: 08/07/2020   History of Present Illness  Pt is a 75 y.o. male admitted from [facility] SNF on 08/05/20 with vomiting. Abdominal CT revealed gastric outlet obstruction requiring NGT. S/p EGD 4/5 showing esophagitis. Course complicated by dementia with delirium. PMH includes dementia, HTN.    Clinical Impression  Pt presents with an overall decrease in functional mobility secondary to above. Pt poor historian with minimal verbalizations; per chart, pt from [facility] nursing facility; suspect pt required assist for mobility and ADL tasks. Today, pt required tactile cues to initiate all mobility tasks, able to stand and take steps with RW and minA. Pt with initial lethargy (suspect post-procedure), becoming more alert with upright activity. Pt would benefit from continued acute PT services to maximize functional mobility and independence prior to return to ALF/SNF.     Follow Up Recommendations Supervision/Assistance - 24 hour (return to SNF/ALF)    Equipment Recommendations  None recommended by PT    Recommendations for Other Services       Precautions / Restrictions Precautions Precautions: Fall;Other (comment) Precaution Comments: Bladder incontinence Restrictions Weight Bearing Restrictions: No      Mobility  Bed Mobility Overal bed mobility: Needs Assistance Bed Mobility: Supine to Sit     Supine to sit: Mod assist;+2 for physical assistance;HOB elevated     General bed mobility comments: Max A for bring BLES towards EOB and then elevating trunk. Poor following commands and decreased arousal initatially    Transfers Overall transfer level: Needs assistance Equipment used: Rolling walker (2 wheeled) Transfers: Sit to/from [employer] Sit to Stand: Min assist;+2 safety/equipment Stand pivot transfers: Min assist;+2 safety/equipment        General transfer comment: Min A to initate sit<>stand and then pt engaging to stand. Pivotal steps to recliner with minA due to posterior lean. Pt not sitting to verbal command, tactile cues to flex hips and then pt sitting  Ambulation/Gait                Stairs            Wheelchair Mobility    Modified Rankin (Stroke Patients Only)       Balance Overall balance assessment: Needs assistance Sitting-balance support: Feet supported;No upper extremity supported Sitting balance-Leahy Scale: Fair     Standing balance support: Bilateral upper extremity supported;During functional activity Standing balance-Leahy Scale: Poor Standing balance comment: External assist due to posterior lean                             Pertinent Vitals/Pain Pain Assessment: Faces Faces Pain Scale: No hurt Pain Intervention(s): Monitored during session    Home Living Family/patient expects to be discharged to:: Skilled nursing facility                 Additional Comments: [facility]    Prior Function Level of Independence: Needs assistance         Comments: Unsure of PLOF due to poor historian and little verbalization during session. Pt seem receptive to use of RW with mobility.     Hand Dominance        Extremity/Trunk Assessment   Upper Extremity Assessment Upper Extremity Assessment: Generalized weakness;Difficult to assess due to impaired cognition    Lower Extremity Assessment Lower Extremity Assessment: Generalized weakness;Difficult to assess due to impaired cognition    Cervical / Trunk Assessment  Cervical / Trunk Assessment: Kyphotic  Communication   Communication: Other (comment) (minimal verbalizations)  Cognition Arousal/Alertness: Lethargic;Suspect due to medications Behavior During Therapy: Flat affect Overall Cognitive Status: History of cognitive impairments - at baseline                                 General  Comments: Unsure of baseline cognition. Pt presenting with decreased arousal initially; becoming more alert once in the recliner. Requiring tactile cues to initiate each task (i.e. bringing hand to face to wash face, power up in standing, etc)      General Comments General comments (skin integrity, edema, etc.): Sitter present at begining and end of session    Exercises     Assessment/Plan    PT Assessment Patient needs continued PT services  PT Problem List Decreased strength;Decreased activity tolerance;Decreased balance;Decreased mobility;Decreased cognition;Decreased knowledge of use of DME       PT Treatment Interventions DME instruction;Gait training;Therapeutic activities;Therapeutic exercise;Balance training;Functional mobility training;Cognitive remediation;Patient/family education    PT Goals (Current goals can be found in the Care Plan section)  Acute Rehab PT Goals Patient Stated Goal: Unstated PT Goal Formulation: Patient unable to participate in goal setting Time For Goal Achievement: 08/21/20    Frequency Min 2X/week   Barriers to discharge        Co-evaluation PT/OT/SLP Co-Evaluation/Treatment: Yes Reason for Co-Treatment: Necessary to address cognition/behavior during functional activity;For patient/therapist safety;To address functional/ADL transfers PT goals addressed during session: Mobility/safety with mobility;Balance;Proper use of DME OT goals addressed during session: ADL's and self-care       AM-PAC PT "6 Clicks" Mobility  Outcome Measure Help needed turning from your back to your side while in a flat bed without using bedrails?: A Lot Help needed moving from lying on your back to sitting on the side of a flat bed without using bedrails?: A Lot Help needed moving to and from a bed to a chair (including a wheelchair)?: A Little Help needed standing up from a chair using your arms (e.g., wheelchair or bedside chair)?: A Little Help needed to walk in  hospital room?: A Little Help needed climbing 3-5 steps with a railing? : A Lot 6 Click Score: 15    End of Session Equipment Utilized During Treatment: Gait belt Activity Tolerance: Patient tolerated treatment well Patient left: in chair;with call bell/phone within reach;with chair alarm set;with nursing/sitter in room Nurse Communication: Mobility status PT Visit Diagnosis: Other abnormalities of gait and mobility (R26.89);Muscle weakness (generalized) (M62.81)    Time: 0962-8366 PT Time Calculation (min) (ACUTE ONLY): 22 min   Charges:   PT Evaluation $PT Eval Moderate Complexity: 1 Mod         Ina Homes, PT, DPT Acute Rehabilitation Services  Pager [phone] Office [phone]  Malachy Chamber 08/07/2020, 5:25 PM

## 2020-08-07 NOTE — Progress Notes (Signed)
2nd attempt at NG tube unsucessful, removed from pt. Will report to dayshift RN, as pt continues to take out NG in restraints and mittens

## 2020-08-07 NOTE — Anesthesia Procedure Notes (Signed)
Procedure Name: MAC Date/Time: 08/07/2020 10:24 AM Performed by: Dorthea Cove, CRNA Pre-anesthesia Checklist: Patient identified, Emergency Drugs available, Patient being monitored, Suction available and Timeout performed Patient Re-evaluated:Patient Re-evaluated prior to induction Oxygen Delivery Method: Nasal cannula Preoxygenation: Pre-oxygenation with 100% oxygen Induction Type: IV induction Placement Confirmation: CO2 detector Dental Injury: Teeth and Oropharynx as per pre-operative assessment

## 2020-08-07 NOTE — Evaluation (Signed)
Occupational Therapy Evaluation Patient Details Name: Blake Lutz MRN: [id number] DOB: 06-03-1945 Today's Date: 08/07/2020    History of Present Illness Pt is a 75 y.o. male admitted from [facility] SNF on 08/05/20 with vomiting. Abdominal CT revealed gastric outlet obstruction requiring NGT. S/p EGD 4/5 showing esophagitis. Course complicated by dementia with delirium. PMH includes dementia, HTN.   Clinical Impression   PTA, pt was living at [facility] (nursing facility) and unsure of PLOF as pt with baseline dementia and poor historian. Currently, pt requires Min A for grooming/self feeding, Mod A for UB ADLs, Max A for LB ADLs, and Min A for functional mobility using RW. Pt presenting with decreased balance, cognition, and activity tolerance. Requiring Min tactile cues throughout for initiating each task; once initiated, pt engaging and performing ADLs and transfers. Pt would benefit from further acute OT to facilitate safe dc. Recommend return to [facility] with follow up OT to optimize safety, independence with ADLs, and return to PLOF.     Follow Up Recommendations  SNF (return to [facility], nursing facility) (If ALF with support, then [facility])   Equipment Recommendations  None recommended by OT    Recommendations for Other Services PT consult     Precautions / Restrictions Precautions Precautions: Fall      Mobility Bed Mobility Overal bed mobility: Needs Assistance Bed Mobility: Supine to Sit     Supine to sit: Max assist;+2 for physical assistance     General bed mobility comments: Max A for bring BLES towards EOB and then elevating trunk. Poor following commands and decreased arousal initatially    Transfers Overall transfer level: Needs assistance Equipment used: Rolling walker (2 wheeled) Transfers: Sit to/from [employer] Sit to Stand: Min assist Stand pivot transfers: Min assist       General transfer comment: Min A to initate sit<>stand  and then pt standing. Min A for posteior lean during pivot. Cues for flexion at hips to sit    Balance Overall balance assessment: Needs assistance Sitting-balance support: Feet supported;No upper extremity supported Sitting balance-Leahy Scale: Fair     Standing balance support: Bilateral upper extremity supported;During functional activity Standing balance-Leahy Scale: Poor Standing balance comment: Reliant on Min A for posteiror lean                           ADL either performed or assessed with clinical judgement   ADL Overall ADL's : Needs assistance/impaired Eating/Feeding: Minimal assistance;Cueing for sequencing;Sitting;Set up;Supervision/ safety Eating/Feeding Details (indicate cue type and reason): Min A to initating grasping spoon. Once started, pt participating in self feeding with supervision Grooming: Minimal assistance;Wash/dry face;Sitting Grooming Details (indicate cue type and reason): Min A for intiating Upper Body Bathing: Moderate assistance;Sitting   Lower Body Bathing: Maximal assistance;Sit to/from stand   Upper Body Dressing : Moderate assistance;Sitting   Lower Body Dressing: Maximal assistance;Sit to/from stand   Toilet Transfer: Minimal assistance;+2 for safety/equipment;Stand-pivot;RW Toilet Transfer Details (indicate cue type and reason): Min A to initate sit<>stand and then pt standing. Min A for posteior lean during pivot. Cues for flexion at hips to sit         Functional mobility during ADLs: Minimal assistance;Rolling walker;+2 for safety/equipment General ADL Comments: Pt presenting with decreased cognition impacting his fucntional performance and safety     Vision         Perception     Praxis      Pertinent Vitals/Pain Pain Assessment: Faces Faces Pain  Scale: No hurt Pain Intervention(s): Monitored during session     Hand Dominance     Extremity/Trunk Assessment Upper Extremity Assessment Upper Extremity  Assessment: Generalized weakness   Lower Extremity Assessment Lower Extremity Assessment: Defer to PT evaluation   Cervical / Trunk Assessment Cervical / Trunk Assessment: Kyphotic   Communication Communication Communication: Other (comment) (Few words spoken; stating "yes")   Cognition Arousal/Alertness: Lethargic;Suspect due to medications (Initially drowsy and closing eyes until OOB to recliner and then maintaining eyes open) Behavior During Therapy: Flat affect Overall Cognitive Status: History of cognitive impairments - at baseline                                 General Comments: Unsure of baseline cognition. Pt presenting with decreased arousal initially; becoming more alert once in the recliner. Requiring tactile cues to initiate each task (i.e. bringing hand to face to wash face, power up in standing, etc)   General Comments  Sitter present at begining and end of session    Exercises     Shoulder Instructions      Home Living Family/patient expects to be discharged to:: Skilled nursing facility                                 Additional Comments: [facility]      Prior Functioning/Environment Level of Independence: Needs assistance        Comments: Unsure of PLOF due to poor historian and little verbalization during session. Pt seem receptive to use of RW with mobility.        OT Problem List: Decreased activity tolerance;Impaired balance (sitting and/or standing);Decreased knowledge of use of DME or AE;Decreased knowledge of precautions;Decreased cognition      OT Treatment/Interventions: Self-care/ADL training;Therapeutic exercise;Energy conservation;DME and/or AE instruction;Therapeutic activities;Patient/family education    OT Goals(Current goals can be found in the care plan section) Acute Rehab OT Goals Patient Stated Goal: Unstated OT Goal Formulation: Patient unable to participate in goal setting Time For Goal  Achievement: 08/21/20 Potential to Achieve Goals: Good  OT Frequency: Min 2X/week   Barriers to D/C:            Co-evaluation PT/OT/SLP Co-Evaluation/Treatment: Yes Reason for Co-Treatment: For patient/therapist safety;To address functional/ADL transfers   OT goals addressed during session: ADL's and self-care      AM-PAC OT "6 Clicks" Daily Activity     Outcome Measure Help from another person eating meals?: A Little Help from another person taking care of personal grooming?: A Little Help from another person toileting, which includes using toliet, bedpan, or urinal?: A Little Help from another person bathing (including washing, rinsing, drying)?: A Lot Help from another person to put on and taking off regular upper body clothing?: A Lot Help from another person to put on and taking off regular lower body clothing?: A Lot 6 Click Score: 15   End of Session Equipment Utilized During Treatment: Gait belt;Rolling walker Nurse Communication: Mobility status  Activity Tolerance: Patient tolerated treatment well Patient left: in chair;with call bell/phone within reach;with chair alarm set;with nursing/sitter in room  OT Visit Diagnosis: Unsteadiness on feet (R26.81);Other abnormalities of gait and mobility (R26.89);Muscle weakness (generalized) (M62.81)                Time: 1696-7893 OT Time Calculation (min): 22 min Charges:  OT General Charges $OT Visit:  1 Visit OT Evaluation $OT Eval Low Complexity: 1 Low  Kabao Leite MSOT, OTR/L Acute Rehab Pager: [phone] Office: [phone]  Theodoro Grist Holdan Stucke 08/07/2020, 4:44 PM

## 2020-08-08 ENCOUNTER — Encounter (HOSPITAL_COMMUNITY): Payer: Self-pay | Admitting: Gastroenterology

## 2020-08-08 DIAGNOSIS — K219 Gastro-esophageal reflux disease without esophagitis: Secondary | ICD-10-CM | POA: Diagnosis not present

## 2020-08-08 DIAGNOSIS — K311 Adult hypertrophic pyloric stenosis: Secondary | ICD-10-CM | POA: Diagnosis not present

## 2020-08-08 DIAGNOSIS — E870 Hyperosmolality and hypernatremia: Secondary | ICD-10-CM | POA: Diagnosis not present

## 2020-08-08 DIAGNOSIS — I10 Essential (primary) hypertension: Secondary | ICD-10-CM | POA: Diagnosis not present

## 2020-08-08 LAB — BASIC METABOLIC PANEL
Anion gap: 5 (ref 5–15)
BUN: 10 mg/dL (ref 8–23)
CO2: 28 mmol/L (ref 22–32)
Calcium: 10.1 mg/dL (ref 8.9–10.3)
Chloride: 119 mmol/L — ABNORMAL HIGH (ref 98–111)
Creatinine, Ser: 0.98 mg/dL (ref 0.61–1.24)
GFR, Estimated: >60 mL/min (ref >60)
Glucose, Bld: 118 mg/dL — ABNORMAL HIGH (ref 70–99)
Potassium: 3.6 mmol/L (ref 3.5–5.1)
Sodium: 152 mmol/L — ABNORMAL HIGH (ref 135–145)

## 2020-08-08 LAB — MAGNESIUM: Magnesium: 2.1 mg/dL (ref 1.7–2.4)

## 2020-08-08 MED ORDER — SODIUM CHLORIDE 0.9 % IV SOLN
1.0000 g | INTRAVENOUS | Status: DC
  Administered 2020-08-08: 1 g via INTRAVENOUS
  Filled 2020-08-08 (×2): qty 10

## 2020-08-08 MED ORDER — ENOXAPARIN SODIUM 40 MG/0.4ML ~~LOC~~ SOLN
40.0000 mg | SUBCUTANEOUS | Status: DC
  Administered 2020-08-08 – 2020-08-15 (×8): 40 mg via SUBCUTANEOUS
  Filled 2020-08-08 (×7): qty 0.4

## 2020-08-08 NOTE — Evaluation (Signed)
Clinical/Bedside Swallow Evaluation Patient Details  Name: Blake Lutz MRN: [id number] Date of Birth: Oct 11, 1945  Today's Date: 08/08/2020 Time: SLP Start Time (ACUTE ONLY): 5400 SLP Stop Time (ACUTE ONLY): 0941 SLP Time Calculation (min) (ACUTE ONLY): 24.63 min  Past Medical History:  Past Medical History:  Diagnosis Date  . Alzheimer disease (HCC)   . GERD (gastroesophageal reflux disease)   . Hyperlipidemia   . Hypertension   . Tinnitus    Past Surgical History: History reviewed. No pertinent surgical history. HPI:  Pt is a 75 y.o. male, with PMH of Alzheimer's, GERD, hypertension, dyslipidemia, compression fracture, prostate cancer who presented to the ED on 08/05/2020 with diarrhea. Abdominal CT revealed gastric outlet obstruction requiring NGT. Pt s/p EGD 4/5 which showed esophagitis and acute gastritis; a soft diet was recommended by GI.   Assessment / Plan / Recommendation Clinical Impression  Pt was seen for bedside swallow evaluation. He was unable to provide any meaningful history. Oral mechanism exam was limited due to pt's difficulty following commands; however, oral motor strength and ROM appeared grossly WFL based on his performance during the evaluation. He presented with full, natural dentition. He tolerated all solids and liquids without signs or symptoms of oropharyngeal dysphagia even when challenged with consecutive swallows of >4oz of thin liquids via straw and with meds which were given by RN with thin liquids via straw. Pt's current diet of soft solids and thin liquids may be continued with further advancement per GI's recommendation. Further skilled SLP services are not clinically indicated for swallowing. SLP Visit Diagnosis: Dysphagia, unspecified (R13.10)    Aspiration Risk  Mild aspiration risk    Diet Recommendation Thin liquid (Continue soft diet with advancement to regular per GI's recommendation)   Liquid Administration via: Straw;Cup Medication  Administration: Whole meds with puree Supervision: Staff to assist with self feeding Compensations: Slow rate;Minimize environmental distractions Postural Changes: Seated upright at 90 degrees    Other  Recommendations Oral Care Recommendations: Oral care BID   Follow up Recommendations None      Frequency and Duration            Prognosis Prognosis for Safe Diet Advancement: Good Barriers to Reach Goals: Cognitive deficits      Swallow Study   General Date of Onset: 08/07/20 HPI: Pt is a 75 y.o. male, with PMH of Alzheimer's, GERD, hypertension, dyslipidemia, compression fracture, prostate cancer who presented to the ED on 08/05/2020 with diarrhea. Abdominal CT revealed gastric outlet obstruction requiring NGT. Pt s/p EGD 4/5 which showed esophagitis and acute gastritis; a soft diet was recommended by GI. Type of Study: Bedside Swallow Evaluation Previous Swallow Assessment: none Diet Prior to this Study: Dysphagia 3 (soft);Thin liquids Temperature Spikes Noted: No Respiratory Status: Room air History of Recent Intubation: No Behavior/Cognition: Alert;Cooperative;Pleasant mood Oral Cavity Assessment: Within Functional Limits Oral Care Completed by SLP: No Oral Cavity - Dentition: Adequate natural dentition Vision: Functional for self-feeding Self-Feeding Abilities: Needs assist Patient Positioning: Upright in bed;Postural control adequate for testing Baseline Vocal Quality: Normal Volitional Cough: Cognitively unable to elicit Volitional Swallow: Unable to elicit    Oral/Motor/Sensory Function Overall Oral Motor/Sensory Function:  (Unable to fully assess)   Ice Chips Ice chips: Not tested   Thin Liquid Thin Liquid: Within functional limits Presentation: Straw    Nectar Thick Nectar Thick Liquid: Not tested   Honey Thick Honey Thick Liquid: Not tested   Puree Puree: Within functional limits Presentation: Spoon   Solid     Solid:  Within functional  limits Presentation: Blake Lutz I. Vear Clock, MS, CCC-SLP Acute Rehabilitation Services Office number [phone] Pager [phone]  Blake Lutz 08/08/2020,9:55 AM

## 2020-08-08 NOTE — Plan of Care (Signed)
  Problem: Safety: Goal: Non-violent Restraint(s) Outcome: Progressing   Problem: Clinical Measurements: Goal: Respiratory complications will improve Outcome: Progressing

## 2020-08-08 NOTE — TOC Progression Note (Signed)
Transition of Care [facility]) - Progression Note    Patient Details  Name: Blake Lutz MRN: [id number] Date of Birth: August 11, 1945  [facility]) CM/SW Contact  Beckie Busing, RN Phone Number: [phone]  08/08/2020, 9:18 AM  Clinical Narrative:    CM  Attempted to contact [facility] to verify that patient is eligible to return to the facility. Christian with admissions is not available, message left. Will await return call.         Expected Discharge Plan and Services                                                 Social Determinants of Health (SDOH) Interventions    Readmission Risk Interventions No flowsheet data found.

## 2020-08-08 NOTE — Progress Notes (Signed)
PROGRESS NOTE        PATIENT DETAILS Name: Blake Lutz Age: 75 y.o. Sex: male Date of Birth: Sep 11, 1945 Admit Date: 08/05/2020 Admitting Physician Laqueta Due, DO YQM:GNOIBBC, Provider, MD  Brief Narrative: Patient is a 75 y.o. male dementia, HTN, HLD-who presented with vomiting-further evaluation revealed gastric outlet obstruction.  Significant events: 4/3>> admit from SNF-vomiting-found to have gastric outlet obstruction.  Significant studies: 4/3>> CT abdomen/pelvis: Gastric outlet obstruction.  Antimicrobial therapy: Rocephin: 4/3 x 1 Rocephin: 4/6>>  Microbiology data: 4/3>> urine culture: E. coli  Procedures : 4/5>> EGD: Grade B esophagitis-from vomiting/NG trauma, gastritis, pylorus patent-no neoplasia/inflammation/stricture seen  Consults: GI  DVT Prophylaxis : SQ Lovenox  Subjective: Pulled NG tube out x2 last night-underwent EGD this morning.   Assessment/Plan: Gastric outlet obstruction: Resolved-EGD without any tumor-pylorus was patent.  Suspicion that patient may have had limited inflammation from a viral illness-that may have showed up as gastric outlet obstruction on CT imaging.  Per nursing staff-tolerating diet without any issues.  No vomiting.  Esophagitis gastritis: Seen on EGD-thought to be due to either vomiting or NG trauma-continue PPI  Hypernatremia: Continue D5W-recheck electrolytes tomorrow.  Encourage oral intake.  Hypokalemia: Repleted.  HTN: BP stable-continue beta-blocker-hold HCTZ.  HLD: Continue statin  E. coli UTI: Hard to tell whether patient is symptomatic given dementia-we will plan on treatment with Rocephin for a few days.  Dementia with delirium: Continues to be confused-suspect this is his baseline-May have some delirium due to acute illness/hospitalization-continue Seroquel-remains on Namenda.  Maintain delirium precautions.   Elevated total protein level on admission: Likely a lab  error-protein level this morning within normal limits this morning   Diet: Diet Order            DIET SOFT Room service appropriate? Yes; Fluid consistency: Thin  Diet effective now                  Code Status:  DNR  Family Communication: Spoke with spouse-[phone] over phone on 4/6  Disposition Plan: Status is: Inpatient  Remains inpatient appropriate because:Inpatient level of care appropriate due to severity of illness   Dispo: The patient is from: SNF              Anticipated d/c is to: SNF              Patient currently is not medically stable to d/c.   Difficult to place patient No   Barriers to Discharge: Hypernatremia-on D5W  Antimicrobial agents: Anti-infectives (From admission, onward)   Start     Dose/Rate Route Frequency Ordered Stop   08/05/20 1145  cefTRIAXone (ROCEPHIN) 1 g in sodium chloride 0.9 % 100 mL IVPB        1 g 200 mL/hr over 30 Minutes Intravenous  Once 08/05/20 1138 08/05/20 1247       Time spent: 25- minutes-Greater than 50% of this time was spent in counseling, explanation of diagnosis, planning of further management, and coordination of care.  MEDICATIONS: Scheduled Meds: . memantine  7.5 mg Oral BID  . metoprolol tartrate  25 mg Oral BID  . montelukast  10 mg Oral QHS  . pantoprazole  40 mg Oral QAC breakfast  . pravastatin  40 mg Oral QHS   Continuous Infusions: . dextrose 125 mL/hr (08/08/20 0731)   PRN Meds:.acetaminophen **OR** acetaminophen, albuterol  PHYSICAL EXAM: Vital signs: Vitals:   08/07/20 1700 08/07/20 2002 08/08/20 0506 08/08/20 1202  BP: 122/69 130/78 (!) 141/87 137/83  Pulse: 86 87 74 74  Resp: 18 18 18 18   Temp:  99.1 F (37.3 C) 98 F (36.7 C) 98 F (36.7 C)  TempSrc:  Axillary Axillary Axillary  SpO2: 100% 100% 98% 99%   There were no vitals filed for this visit. There is no height or weight on file to calculate BMI.   Gen Exam:Alert awake-not in any distress HEENT:atraumatic,  normocephalic Chest: B/L clear to auscultation anteriorly CVS:S1S2 regular Abdomen:soft non tender, non distended Extremities:no edema Neurology: Non focal Skin: no rash  I have personally reviewed following labs and imaging studies  LABORATORY DATA: CBC: Recent Labs  Lab 08/05/20 0641 08/06/20 0430 08/07/20 0608  WBC 11.5* 9.6 6.8  HGB 12.4* 11.8* 12.3*  HCT 39.5 37.9* 39.4  MCV 80.6 81.7 81.9  PLT 424* 285 296    Basic Metabolic Panel: Recent Labs  Lab 08/05/20 0641 08/06/20 0430 08/07/20 0608 08/08/20 0206  NA 146* 151* 151* 152*  K 5.1 3.4* 3.0* 3.6  CL 107 116* 118* 119*  CO2 34* 27 26 28   GLUCOSE 131* 104* 140* 118*  BUN 15 12 7* 10  CREATININE 1.00 0.99 0.93 0.98  CALCIUM 9.6 9.8 10.2 10.1  MG  --  2.1  --  2.1  PHOS  --  3.2  --   --     GFR: CrCl cannot be calculated (Unknown ideal weight.).  Liver Function Tests: Recent Labs  Lab 08/05/20 0641 08/06/20 0430 08/07/20 0608  AST 55* 16 18  ALT 14 12 14   ALKPHOS 88 94 92  BILITOT 1.7* 0.7 0.9  PROT >12.0* 6.9 7.2  ALBUMIN 3.2* 3.1* 3.2*   Recent Labs  Lab 08/05/20 0641  LIPASE 40   No results for input(s): AMMONIA in the last 168 hours.  Coagulation Profile: No results for input(s): INR, PROTIME in the last 168 hours.  Cardiac Enzymes: No results for input(s): CKTOTAL, CKMB, CKMBINDEX, TROPONINI in the last 168 hours.  BNP (last 3 results) No results for input(s): PROBNP in the last 8760 hours.  Lipid Profile: No results for input(s): CHOL, HDL, LDLCALC, TRIG, CHOLHDL, LDLDIRECT in the last 72 hours.  Thyroid Function Tests: No results for input(s): TSH, T4TOTAL, FREET4, T3FREE, THYROIDAB in the last 72 hours.  Anemia Panel: No results for input(s): VITAMINB12, FOLATE, FERRITIN, TIBC, IRON, RETICCTPCT in the last 72 hours.  Urine analysis:    Component Value Date/Time   COLORURINE YELLOW 08/05/2020 1034   APPEARANCEUR CLOUDY (A) 08/05/2020 1034   LABSPEC 1.029 08/05/2020  1034   PHURINE 6.0 08/05/2020 1034   GLUCOSEU NEGATIVE 08/05/2020 1034   HGBUR MODERATE (A) 08/05/2020 1034   BILIRUBINUR NEGATIVE 08/05/2020 1034   KETONESUR NEGATIVE 08/05/2020 1034   PROTEINUR 30 (A) 08/05/2020 1034   NITRITE NEGATIVE 08/05/2020 1034   LEUKOCYTESUR LARGE (A) 08/05/2020 1034    Sepsis Labs: Lactic Acid, Venous No results found for: LATICACIDVEN  MICROBIOLOGY: Recent Results (from the past 240 hour(s))  Urine culture     Status: Abnormal   Collection Time: 08/05/20 10:34 AM   Specimen: Urine, Random  Result Value Ref Range Status   Specimen Description URINE, RANDOM  Final   Special Requests   Final    NONE Performed at [facility], [address] [place], [place] [address]    Culture >=100,000 COLONIES/mL ESCHERICHIA COLI (A)  Final  Report Status 08/07/2020 FINAL  Final   Organism ID, Bacteria ESCHERICHIA COLI (A)  Final      Susceptibility   Escherichia coli - MIC*    AMPICILLIN 8 SENSITIVE Sensitive     CEFAZOLIN <=4 SENSITIVE Sensitive     CEFEPIME <=0.12 SENSITIVE Sensitive     CEFTRIAXONE <=0.25 SENSITIVE Sensitive     CIPROFLOXACIN <=0.25 SENSITIVE Sensitive     GENTAMICIN <=1 SENSITIVE Sensitive     IMIPENEM <=0.25 SENSITIVE Sensitive     NITROFURANTOIN <=16 SENSITIVE Sensitive     TRIMETH/SULFA <=20 SENSITIVE Sensitive     AMPICILLIN/SULBACTAM <=2 SENSITIVE Sensitive     PIP/TAZO <=4 SENSITIVE Sensitive     * >=100,000 COLONIES/mL ESCHERICHIA COLI  SARS CORONAVIRUS 2 (TAT 6-24 HRS) Nasopharyngeal Nasopharyngeal Swab     Status: None   Collection Time: 08/05/20 12:04 PM   Specimen: Nasopharyngeal Swab  Result Value Ref Range Status   SARS Coronavirus 2 NEGATIVE NEGATIVE Final    Comment: (NOTE) SARS-CoV-2 target nucleic acids are NOT DETECTED.  The SARS-CoV-2 RNA is generally detectable in upper and lower respiratory specimens during the acute phase of infection. Negative results do not preclude SARS-CoV-2 infection, do not rule  out co-infections with other pathogens, and should not be used as the sole basis for treatment or other patient management decisions. Negative results must be combined with clinical observations, patient history, and epidemiological information. The expected result is Negative.  Fact Sheet for Patients: HairSlick.no  Fact Sheet for Healthcare Providers: quierodirigir.com  This test is not yet approved or cleared by the [place] FDA and  has been authorized for detection and/or diagnosis of SARS-CoV-2 by FDA under an Emergency Use Authorization (EUA). This EUA will remain  in effect (meaning this test can be used) for the duration of the COVID-19 declaration under Se ction 564(b)(1) of the Act, 21 U.S.C. section 360bbb-3(b)(1), unless the authorization is terminated or revoked sooner.  Performed at [facility], [address], [place], [place] [address]     RADIOLOGY STUDIES/RESULTS: DG Abd 1 View  Result Date: 08/07/2020 CLINICAL DATA:  NG tube placement. EXAM: ABDOMEN - 1 VIEW COMPARISON:  Prior study same day. FINDINGS: NG tube has been withdrawn and is noted with its tip over the upper chest. NG tube tip could be and the thoracic esophagus or right mainstem bronchus. Repositioning suggested. Nondistended air-filled loops of small bowel noted. No free air. Degenerative changes scoliosis thoracic spine. IMPRESSION: NG tube has been withdrawn and is noted with its tip over the upper chest. NG tube tip could be in the thoracic esophagus or right mainstem bronchus. Repositioning suggested. These results will be called to the ordering clinician or representative by the Radiologist Assistant, and communication documented in the PACS or [employer]. Electronically Signed   By: Maisie Fus  Register   On: 08/07/2020 06:20   DG Abd 1 View  Result Date: 08/07/2020 CLINICAL DATA:  NG tube placement EXAM: ABDOMEN - 1 VIEW  COMPARISON:  08/06/2020 FINDINGS: An enteric tube is present with tip in the left upper quadrant consistent with location in the upper stomach. Proximal side hole is at or just below the expected level of the EG junction. No change in position since previous study. Visualized bowel gas pattern is normal. IMPRESSION: Enteric tube tip is in the left upper quadrant, likely in the upper stomach. Proximal side hole is at or just below the expected level of the GE junction. Electronically Signed   By: Chrissie Noa  Andria Meuse M.D.   On: 08/07/2020 02:22   DG Abd Portable 1V  Result Date: 08/06/2020 CLINICAL DATA:  Nasogastric tube placement EXAM: PORTABLE ABDOMEN - 1 VIEW COMPARISON:  Portable exam 1317 hours without priors for comparison FINDINGS: Tip of nasogastric tube projects over proximal stomach. Nonobstructive bowel gas pattern. Osseous structures significant only for scattered degenerative disc disease changes. No definite urinary tract calcifications. IMPRESSION: Tip of nasogastric tube projects over proximal stomach. Electronically Signed   By: Ulyses Southward M.D.   On: 08/06/2020 15:07     LOS: 3 days   Jeoffrey Massed, MD  [facility]    To contact the attending provider between 7A-7P or the covering provider during after hours 7P-7A, please log into the web site [email or web address] and access using universal [place] password for that web site. If you do not have the password, please call the hospital operator.  08/08/2020, 12:15 PM

## 2020-08-08 NOTE — Progress Notes (Signed)
Pt attempting to remove IV acces. Patient not redirectable. Bilateral soft wrist restraints applied. MD order placed

## 2020-08-09 DIAGNOSIS — K219 Gastro-esophageal reflux disease without esophagitis: Secondary | ICD-10-CM | POA: Diagnosis not present

## 2020-08-09 DIAGNOSIS — K311 Adult hypertrophic pyloric stenosis: Secondary | ICD-10-CM | POA: Diagnosis not present

## 2020-08-09 DIAGNOSIS — I10 Essential (primary) hypertension: Secondary | ICD-10-CM | POA: Diagnosis not present

## 2020-08-09 DIAGNOSIS — E870 Hyperosmolality and hypernatremia: Secondary | ICD-10-CM | POA: Diagnosis not present

## 2020-08-09 LAB — BASIC METABOLIC PANEL
Anion gap: 5 (ref 5–15)
BUN: 10 mg/dL (ref 8–23)
CO2: 29 mmol/L (ref 22–32)
Calcium: 9.7 mg/dL (ref 8.9–10.3)
Chloride: 113 mmol/L — ABNORMAL HIGH (ref 98–111)
Creatinine, Ser: 1.03 mg/dL (ref 0.61–1.24)
GFR, Estimated: >60 mL/min (ref >60)
Glucose, Bld: 151 mg/dL — ABNORMAL HIGH (ref 70–99)
Potassium: 3.2 mmol/L — ABNORMAL LOW (ref 3.5–5.1)
Sodium: 147 mmol/L — ABNORMAL HIGH (ref 135–145)

## 2020-08-09 MED ORDER — CEPHALEXIN 500 MG PO CAPS
500.0000 mg | ORAL_CAPSULE | Freq: Two times a day (BID) | ORAL | Status: AC
  Administered 2020-08-09 – 2020-08-12 (×8): 500 mg via ORAL
  Filled 2020-08-09 (×8): qty 1

## 2020-08-09 MED ORDER — QUETIAPINE FUMARATE 25 MG PO TABS
37.5000 mg | ORAL_TABLET | Freq: Every day | ORAL | Status: DC
  Administered 2020-08-09 – 2020-08-15 (×7): 37.5 mg via ORAL
  Filled 2020-08-09 (×8): qty 2

## 2020-08-09 MED ORDER — QUETIAPINE FUMARATE 25 MG PO TABS: 37.5000 mg | ORAL_TABLET | Freq: Every day | ORAL | Status: DC

## 2020-08-09 MED ORDER — HALOPERIDOL LACTATE 5 MG/ML IJ SOLN
2.0000 mg | Freq: Four times a day (QID) | INTRAMUSCULAR | Status: DC | PRN
  Administered 2020-08-10 – 2020-08-12 (×4): 2 mg via INTRAVENOUS
  Filled 2020-08-09 (×5): qty 1

## 2020-08-09 MED ORDER — POTASSIUM CHLORIDE 20 MEQ PO PACK
40.0000 meq | PACK | ORAL | Status: AC
  Administered 2020-08-09 (×2): 40 meq via ORAL
  Filled 2020-08-09 (×2): qty 2

## 2020-08-09 MED ORDER — QUETIAPINE FUMARATE 50 MG PO TABS
50.0000 mg | ORAL_TABLET | Freq: Every day | ORAL | Status: DC
  Administered 2020-08-09 – 2020-08-15 (×7): 50 mg via ORAL
  Filled 2020-08-09 (×6): qty 1

## 2020-08-09 NOTE — Plan of Care (Signed)

## 2020-08-09 NOTE — TOC Progression Note (Addendum)
Transition of Care [facility]) - Progression Note    Patient Details  Name: Rondale Nies MRN: [id number] Date of Birth: 01/07/1946  [facility] of [facility]) CM/SW Contact  Beckie Busing, RN Phone Number: [phone]  08/09/2020, 3:39 PM  Clinical Narrative:    CM attempted to call Christian in admissions at [facility] [phone]) [name] is currently on another line. Message has been left.   1600 CM called [employer] to determine if CM is to initiate insurance auth. Per [employer] the facility must initiate through patients health plan. [employer] does not initiate for assisted living facilities.          Expected Discharge Plan and Services                                                 Social Determinants of Health (SDOH) Interventions    Readmission Risk Interventions No flowsheet data found.

## 2020-08-09 NOTE — Progress Notes (Signed)
E.coli came back pan sensitive. Ok to change ceftriaxone to Keflex to complete a total of 5d per Dr Jerral Ralph.  Ulyses Southward, PharmD, BCIDP, AAHIVP, CPP Infectious Disease Pharmacist 08/09/2020 10:47 AM

## 2020-08-09 NOTE — Progress Notes (Signed)
PROGRESS NOTE        PATIENT DETAILS Name: Blake Lutz Age: 75 y.o. Sex: male Date of Birth: 02/09/1946 Admit Date: 08/05/2020 Admitting Physician Laqueta Due, DO XTG:GYIRSWN, Provider, MD  Brief Narrative: Patient is a 75 y.o. male dementia, HTN, HLD-who presented with vomiting-further evaluation revealed gastric outlet obstruction.  Significant events: 4/3>> admit from SNF-vomiting-found to have gastric outlet obstruction.  Significant studies: 4/3>> CT abdomen/pelvis: Gastric outlet obstruction.  Antimicrobial therapy: Rocephin: 4/3 x 1 Rocephin: 4/6>>  Microbiology data: 4/3>> urine culture: E. coli  Procedures : 4/5>> EGD: Grade B esophagitis-from vomiting/NG trauma, gastritis, pylorus patent-no neoplasia/inflammation/stricture seen  Consults: GI  DVT Prophylaxis : SQ Lovenox  Subjective: Was off restraints yesterday-became agitated last evening-back on restraints.  Still somewhat confused and wanted him to get out of bed this morning.  Assessment/Plan: Gastric outlet obstruction: Resolved-EGD without any tumor-pylorus was patent.  Suspicion that patient may have had limited inflammation from a viral illness-that may have showed up as gastric outlet obstruction on CT imaging.  Per nursing staff-tolerating diet without any issues.  No vomiting.  Esophagitis gastritis: Seen on EGD-thought to be due to either vomiting or NG trauma-continue PPI  Hypernatremia: Improving-on D5W-repeat electrolytes tomorrow.  Hypokalemia: Replete and recheck.  HTN: BP stable-continue beta-blocker-hold HCTZ.  HLD: Continue statin  E. coli UTI: Hard to tell whether patient is symptomatic given dementia-on Rocephin-has been transitioned to Keflex for a few more days.  Dementia with delirium: Agitated last night-back on restraints-remain on Seroquel-have added as needed Haldol-remains on Namenda.  Have asked RN to see if we can get him off  restraints-follow closely.  Elevated total protein level on admission: Likely a lab error-protein level this morning within normal limits this morning   Diet: Diet Order            DIET SOFT Room service appropriate? No; Fluid consistency: Thin  Diet effective now                  Code Status:  DNR  Family Communication: Spouse-[phone]-left a voicemail on 4/7  Disposition Plan: Status is: Inpatient  Remains inpatient appropriate because:Inpatient level of care appropriate due to severity of illness   Dispo: The patient is from: SNF              Anticipated d/c is to: SNF              Patient currently is not medically stable to d/c.   Difficult to place patient No   Barriers to Discharge: Hypernatremia-on D5W-agitation-medications being adjusted.  Antimicrobial agents: Anti-infectives (From admission, onward)   Start     Dose/Rate Route Frequency Ordered Stop   08/09/20 1145  cephALEXin (KEFLEX) capsule 500 mg        500 mg Oral Every 12 hours 08/09/20 1047 08/13/20 0959   08/08/20 1400  cefTRIAXone (ROCEPHIN) 1 g in sodium chloride 0.9 % 100 mL IVPB  Status:  Discontinued        1 g 200 mL/hr over 30 Minutes Intravenous Every 24 hours 08/08/20 1216 08/09/20 1047   08/05/20 1145  cefTRIAXone (ROCEPHIN) 1 g in sodium chloride 0.9 % 100 mL IVPB        1 g 200 mL/hr over 30 Minutes Intravenous  Once 08/05/20 1138 08/05/20 1247       Time spent: 25- minutes-Greater  than 50% of this time was spent in counseling, explanation of diagnosis, planning of further management, and coordination of care.  MEDICATIONS: Scheduled Meds: . cephALEXin  500 mg Oral Q12H  . enoxaparin (LOVENOX) injection  40 mg Subcutaneous Q24H  . memantine  7.5 mg Oral BID  . metoprolol tartrate  25 mg Oral BID  . montelukast  10 mg Oral QHS  . pantoprazole  40 mg Oral QAC breakfast  . potassium chloride  40 mEq Oral Q4H  . pravastatin  40 mg Oral QHS  . QUEtiapine  37.5 mg Oral  Daily  . QUEtiapine  50 mg Oral QAC supper   Continuous Infusions: . dextrose 125 mL/hr at 08/09/20 0716   PRN Meds:.acetaminophen **OR** acetaminophen, albuterol, haloperidol lactate   PHYSICAL EXAM: Vital signs: Vitals:   08/08/20 0506 08/08/20 1202 08/08/20 2012 08/09/20 0503  BP: (!) 141/87 137/83 (!) 172/74 (!) 169/69  Pulse: 74 74 77 84  Resp: 18 18 20 18   Temp: 98 F (36.7 C) 98 F (36.7 C) 98.4 F (36.9 C) 97.9 F (36.6 C)  TempSrc: Axillary Axillary Axillary Axillary  SpO2: 98% 99% 98% 100%   There were no vitals filed for this visit. There is no height or weight on file to calculate BMI.   Gen Exam: Confused-but not in any distress. HEENT:atraumatic, normocephalic Chest: B/L clear to auscultation anteriorly CVS:S1S2 regular Abdomen:soft non tender, non distended Extremities:no edema Neurology: Non focal Skin: no rash  I have personally reviewed following labs and imaging studies  LABORATORY DATA: CBC: Recent Labs  Lab 08/05/20 0641 08/06/20 0430 08/07/20 0608  WBC 11.5* 9.6 6.8  HGB 12.4* 11.8* 12.3*  HCT 39.5 37.9* 39.4  MCV 80.6 81.7 81.9  PLT 424* 285 296    Basic Metabolic Panel: Recent Labs  Lab 08/05/20 0641 08/06/20 0430 08/07/20 0608 08/08/20 0206 08/09/20 0128  NA 146* 151* 151* 152* 147*  K 5.1 3.4* 3.0* 3.6 3.2*  CL 107 116* 118* 119* 113*  CO2 34* 27 26 28 29   GLUCOSE 131* 104* 140* 118* 151*  BUN 15 12 7* 10 10  CREATININE 1.00 0.99 0.93 0.98 1.03  CALCIUM 9.6 9.8 10.2 10.1 9.7  MG  --  2.1  --  2.1  --   PHOS  --  3.2  --   --   --     GFR: CrCl cannot be calculated (Unknown ideal weight.).  Liver Function Tests: Recent Labs  Lab 08/05/20 0641 08/06/20 0430 08/07/20 0608  AST 55* 16 18  ALT 14 12 14   ALKPHOS 88 94 92  BILITOT 1.7* 0.7 0.9  PROT >12.0* 6.9 7.2  ALBUMIN 3.2* 3.1* 3.2*   Recent Labs  Lab 08/05/20 0641  LIPASE 40   No results for input(s): AMMONIA in the last 168 hours.  Coagulation  Profile: No results for input(s): INR, PROTIME in the last 168 hours.  Cardiac Enzymes: No results for input(s): CKTOTAL, CKMB, CKMBINDEX, TROPONINI in the last 168 hours.  BNP (last 3 results) No results for input(s): PROBNP in the last 8760 hours.  Lipid Profile: No results for input(s): CHOL, HDL, LDLCALC, TRIG, CHOLHDL, LDLDIRECT in the last 72 hours.  Thyroid Function Tests: No results for input(s): TSH, T4TOTAL, FREET4, T3FREE, THYROIDAB in the last 72 hours.  Anemia Panel: No results for input(s): VITAMINB12, FOLATE, FERRITIN, TIBC, IRON, RETICCTPCT in the last 72 hours.  Urine analysis:    Component Value Date/Time   COLORURINE YELLOW 08/05/2020 1034   APPEARANCEUR  CLOUDY (A) 08/05/2020 1034   LABSPEC 1.029 08/05/2020 1034   PHURINE 6.0 08/05/2020 1034   GLUCOSEU NEGATIVE 08/05/2020 1034   HGBUR MODERATE (A) 08/05/2020 1034   BILIRUBINUR NEGATIVE 08/05/2020 1034   KETONESUR NEGATIVE 08/05/2020 1034   PROTEINUR 30 (A) 08/05/2020 1034   NITRITE NEGATIVE 08/05/2020 1034   LEUKOCYTESUR LARGE (A) 08/05/2020 1034    Sepsis Labs: Lactic Acid, Venous No results found for: LATICACIDVEN  MICROBIOLOGY: Recent Results (from the past 240 hour(s))  Urine culture     Status: Abnormal   Collection Time: 08/05/20 10:34 AM   Specimen: Urine, Random  Result Value Ref Range Status   Specimen Description URINE, RANDOM  Final   Special Requests   Final    NONE Performed at [facility], [address], [place], [place] [address]    Culture >=100,000 COLONIES/mL ESCHERICHIA COLI (A)  Final   Report Status 08/07/2020 FINAL  Final   Organism ID, Bacteria ESCHERICHIA COLI (A)  Final      Susceptibility   Escherichia coli - MIC*    AMPICILLIN 8 SENSITIVE Sensitive     CEFAZOLIN <=4 SENSITIVE Sensitive     CEFEPIME <=0.12 SENSITIVE Sensitive     CEFTRIAXONE <=0.25 SENSITIVE Sensitive     CIPROFLOXACIN <=0.25 SENSITIVE Sensitive     GENTAMICIN <=1 SENSITIVE Sensitive      IMIPENEM <=0.25 SENSITIVE Sensitive     NITROFURANTOIN <=16 SENSITIVE Sensitive     TRIMETH/SULFA <=20 SENSITIVE Sensitive     AMPICILLIN/SULBACTAM <=2 SENSITIVE Sensitive     PIP/TAZO <=4 SENSITIVE Sensitive     * >=100,000 COLONIES/mL ESCHERICHIA COLI  SARS CORONAVIRUS 2 (TAT 6-24 HRS) Nasopharyngeal Nasopharyngeal Swab     Status: None   Collection Time: 08/05/20 12:04 PM   Specimen: Nasopharyngeal Swab  Result Value Ref Range Status   SARS Coronavirus 2 NEGATIVE NEGATIVE Final    Comment: (NOTE) SARS-CoV-2 target nucleic acids are NOT DETECTED.  The SARS-CoV-2 RNA is generally detectable in upper and lower respiratory specimens during the acute phase of infection. Negative results do not preclude SARS-CoV-2 infection, do not rule out co-infections with other pathogens, and should not be used as the sole basis for treatment or other patient management decisions. Negative results must be combined with clinical observations, patient history, and epidemiological information. The expected result is Negative.  Fact Sheet for Patients: HairSlick.no  Fact Sheet for Healthcare Providers: quierodirigir.com  This test is not yet approved or cleared by the [place] FDA and  has been authorized for detection and/or diagnosis of SARS-CoV-2 by FDA under an Emergency Use Authorization (EUA). This EUA will remain  in effect (meaning this test can be used) for the duration of the COVID-19 declaration under Se ction 564(b)(1) of the Act, 21 U.S.C. section 360bbb-3(b)(1), unless the authorization is terminated or revoked sooner.  Performed at [facility], [address], [place], [place] [address]     RADIOLOGY STUDIES/RESULTS: No results found.   LOS: 4 days   Jeoffrey Massed, MD  [facility]    To contact the attending provider between 7A-7P or the covering provider during after hours 7P-7A, please log into  the web site [email or web address] and access using universal [place] password for that web site. If you do not have the password, please call the hospital operator.  08/09/2020, 11:32 AM

## 2020-08-10 DIAGNOSIS — E870 Hyperosmolality and hypernatremia: Secondary | ICD-10-CM | POA: Diagnosis not present

## 2020-08-10 DIAGNOSIS — K219 Gastro-esophageal reflux disease without esophagitis: Secondary | ICD-10-CM | POA: Diagnosis not present

## 2020-08-10 DIAGNOSIS — I10 Essential (primary) hypertension: Secondary | ICD-10-CM | POA: Diagnosis not present

## 2020-08-10 DIAGNOSIS — K311 Adult hypertrophic pyloric stenosis: Secondary | ICD-10-CM | POA: Diagnosis not present

## 2020-08-10 LAB — BASIC METABOLIC PANEL
Anion gap: 7 (ref 5–15)
BUN: 9 mg/dL (ref 8–23)
CO2: 26 mmol/L (ref 22–32)
Calcium: 9.3 mg/dL (ref 8.9–10.3)
Chloride: 114 mmol/L — ABNORMAL HIGH (ref 98–111)
Creatinine, Ser: 0.91 mg/dL (ref 0.61–1.24)
GFR, Estimated: >60 mL/min (ref >60)
Glucose, Bld: 133 mg/dL — ABNORMAL HIGH (ref 70–99)
Potassium: 3.1 mmol/L — ABNORMAL LOW (ref 3.5–5.1)
Sodium: 147 mmol/L — ABNORMAL HIGH (ref 135–145)

## 2020-08-10 LAB — MAGNESIUM: Magnesium: 1.9 mg/dL (ref 1.7–2.4)

## 2020-08-10 MED ORDER — POTASSIUM CHLORIDE 10 MEQ/100ML IV SOLN
10.0000 meq | INTRAVENOUS | Status: AC
  Administered 2020-08-10 (×3): 10 meq via INTRAVENOUS
  Filled 2020-08-10 (×3): qty 100

## 2020-08-10 NOTE — Plan of Care (Signed)

## 2020-08-10 NOTE — Plan of Care (Signed)
  Problem: Safety: Goal: Non-violent Restraint(s) Outcome: Progressing   Problem: Education: Goal: Knowledge of General Education information will improve Description: Including pain rating scale, medication(s)/side effects and non-pharmacologic comfort measures Outcome: Progressing   Problem: Activity: Goal: Risk for activity intolerance will decrease Outcome: Progressing   Problem: Coping: Goal: Level of anxiety will decrease Outcome: Progressing   Problem: Elimination: Goal: Will not experience complications related to bowel motility Outcome: Progressing   Problem: Safety: Goal: Ability to remain free from injury will improve Outcome: Progressing   Problem: Safety: Goal: Non-violent Restraint(s) Outcome: Progressing

## 2020-08-10 NOTE — [place] FL2 (Signed)
[place] MEDICAID FL2 LEVEL OF CARE SCREENING TOOL     IDENTIFICATION  Patient Name: Blake Lutz Birthdate: [birth year] Sex: male Admission Date (Current Location): 08/05/2020  [facility] and [place] Number:  Producer, television/film/video and Address:  [facility], [address], [place], [place] [address]      Provider Number: [id number]  Attending Physician Name and Address:  Maretta Bees, MD  Relative Name and Phone Number:  Patricia Perales (spouse) [phone]    Current Level of Care: Hospital Recommended Level of Care: Assisted Living Facility Prior Approval Number:    Date Approved/Denied:   PASRR Number: [id number]  Discharge Plan: Other (Comment) (Assisted Living Facility)    Current Diagnoses: Patient Active Problem List   Diagnosis Date Noted  . 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 Alzheimer disease (HCC) 08/05/2020  . Diarrhea 08/05/2020  . GERD (gastroesophageal reflux disease) 08/05/2020  . Essential hypertension 08/05/2020  . Hypernatremia 08/05/2020  . 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    Orientation RESPIRATION BLADDER Height & Weight      (disoriented)  Normal External catheter Weight:   Height:     BEHAVIORAL SYMPTOMS/MOOD NEUROLOGICAL BOWEL NUTRITION STATUS     (n/a) Continent Diet (soft diet)  AMBULATORY STATUS COMMUNICATION OF NEEDS Skin   Limited Assist Verbally (delayed responses) Normal                       Personal Care Assistance Level of Assistance  Bathing,Feeding,Dressing,Total care Bathing Assistance: Limited assistance Feeding assistance: Limited assistance Dressing Assistance: Limited assistance Total Care Assistance: Limited assistance   Functional Limitations Info  Sight,Hearing,Speech   Hearing Info: Adequate Speech Info: Impaired (delayed responses)    SPECIAL CARE FACTORS FREQUENCY  PT (By licensed PT),OT (By licensed OT)     PT Frequency: 3X OT Frequency: 3X             Contractures Contractures Info: Not present    Additional Factors Info  Code Status,Allergies,Psychotropic,Insulin Sliding Scale,Isolation Precautions,Suctioning Needs Code Status Info: DNR Allergies Info: Ibuprofen, shellfish Psychotropic Info: Namenda, Seroquel Insulin Sliding Scale Info: See discharge summary for sliding scale info Isolation Precautions Info: none Suctioning Needs: n/a   Current Medications (08/10/2020):  This is the current hospital active medication list Current Facility-Administered Medications  Medication Dose Route Frequency Provider Last Rate Last Admin  . acetaminophen (TYLENOL) tablet 650 mg  650 mg Oral Q6H PRN Charlie Pitter III, MD       Or  . acetaminophen (TYLENOL) suppository 650 mg  650 mg Rectal Q6H PRN Charlie Pitter III, MD      . albuterol (PROVENTIL) (2.5 MG/3ML) 0.083% nebulizer solution 2.5 mg  2.5 mg Nebulization Q4H PRN Charlie Pitter III, MD      . cephALEXin (KEFLEX) capsule 500 mg  500 mg Oral Q12H Pham, Minh Q, RPH-CPP   500 mg at 08/10/20 0900  . dextrose 5 % solution   Intravenous Continuous Maretta Bees, MD 125 mL/hr at 08/10/20 0735 New Bag at 08/10/20 0735  . enoxaparin (LOVENOX) injection 40 mg  40 mg Subcutaneous Q24H Maretta Bees, MD   40 mg at 08/09/20 1355  . haloperidol lactate (HALDOL) injection 2 mg  2 mg Intravenous Q6H PRN Maretta Bees, MD      . memantine [facility]) tablet 7.5 mg  7.5 mg Oral BID Maretta Bees, MD   7.5 mg at 08/10/20 0855  . metoprolol tartrate (LOPRESSOR) tablet 25 mg  25 mg Oral BID Maretta Bees, MD   25 mg at 08/10/20 0900  . montelukast (SINGULAIR) tablet 10 mg  10 mg Oral QHS Maretta Bees, MD   10 mg at 08/09/20 2040  . pantoprazole (PROTONIX) EC tablet 40 mg  40 mg Oral QAC breakfast Maretta Bees, MD   40 mg at 08/10/20 0856  . potassium chloride 10 mEq in 100 mL IVPB  10 mEq Intravenous Q1 Hr x 3 Ghimire, Werner Lean, MD 100 mL/hr at 08/10/20 0943 10 mEq at  08/10/20 0943  . pravastatin (PRAVACHOL) tablet 40 mg  40 mg Oral QHS Maretta Bees, MD   40 mg at 08/09/20 2040  . QUEtiapine (SEROQUEL) tablet 37.5 mg  37.5 mg Oral Daily Maretta Bees, MD   37.5 mg at 08/10/20 0856  . QUEtiapine (SEROQUEL) tablet 50 mg  50 mg Oral QAC supper Maretta Bees, MD   50 mg at 08/09/20 1840     Discharge Medications: Please see discharge summary for a list of discharge medications.  Relevant Imaging Results:  Relevant Lab Results:   Additional Information SS# [id number]  Beckie Busing, RN

## 2020-08-10 NOTE — Progress Notes (Signed)
Physical Therapy Treatment Patient Details Name: Blake Lutz MRN: [id number] DOB: 01-01-1946 Today's Date: 08/10/2020    History of Present Illness Pt is a 75 y.o. male admitted from [facility] SNF on 08/05/20 with vomiting. Abdominal CT revealed gastric outlet obstruction requiring NGT. S/p EGD 4/5 showing esophagitis. Course complicated by dementia with delirium. PMH includes dementia, HTN.    PT Comments    Pt tolerates treatment well with progression to ambulation. Pt often requires physical assistance to initiate mobility and remains at a high falls risk due to imbalance and impaired safety awareness. Pt remains limited in bed mobility and transfers due to lethargy initially during session, with mobility improving later in session when more alert. Pt will continue to benefit from aggressive mobilization and PT POC to reduce falls risk and improve transfer quality. PT continues to recommend return to SNF when medically ready.   Follow Up Recommendations  Supervision/Assistance - 24 hour (return to SNF/ALF)     Equipment Recommendations  None recommended by PT    Recommendations for Other Services       Precautions / Restrictions Precautions Precautions: Fall;Other (comment) Precaution Comments: Bladder incontinence Restrictions Weight Bearing Restrictions: No    Mobility  Bed Mobility Overal bed mobility: Needs Assistance Bed Mobility: Supine to Sit     Supine to sit: Max assist;HOB elevated     General bed mobility comments: pt with limited initiation, requires tactile cueing and physical assistance. Pt does pull through PT UE support to bring trunk into upright position    Transfers Overall transfer level: Needs assistance Equipment used: Rolling walker (2 wheeled);1 person hand held assist Transfers: Sit to/from [employer] Sit to Stand: Mod assist;Max assist Stand pivot transfers: Min assist;Mod assist (modA to come to stand, minA to pivot once  standing)       General transfer comment: pt requires tactile and verbal cues, physical assistance to weightshift anteriorly to initiate stand and significant physical assistance to power up into standing  Ambulation/Gait Ambulation/Gait assistance: Min assist Gait Distance (Feet): 200 Feet (additional trial of 83' with RW) Assistive device: Rolling walker (2 wheeled);None Gait Pattern/deviations: Step-to pattern;Shuffle;Drifts right/left Gait velocity: reduced Gait velocity interpretation: <1.31 ft/sec, indicative of household ambulator General Gait Details: pt with short shuffling steps, tends to drift to R side requiring PT physical assistance to maintain path. Pt often freezes when meeting obstacles in path, requires tactile cues to initiate change of direction   Stairs             Wheelchair Mobility    Modified Rankin (Stroke Patients Only)       Balance Overall balance assessment: Needs assistance Sitting-balance support: Single extremity supported;Feet supported Sitting balance-Leahy Scale: Poor Sitting balance - Comments: minA, R lateral lean onto elbow Postural control: Right lateral lean Standing balance support: No upper extremity supported;Single extremity supported;Bilateral upper extremity supported Standing balance-Leahy Scale: Poor Standing balance comment: minA with UE support, R lateral lean                            Cognition Arousal/Alertness: Lethargic (does become alert with stimulation and mobility) Behavior During Therapy: Flat affect Overall Cognitive Status: History of cognitive impairments - at baseline                                 General Comments: baseline with dementia, pt inconsistently follows one step commands.  Pt seems to have limited to no awareness of deficits or safety.      Exercises      General Comments General comments (skin integrity, edema, etc.): VSS on RA      Pertinent Vitals/Pain  Pain Assessment: Faces Faces Pain Scale: No hurt    Home Living                      Prior Function            PT Goals (current goals can now be found in the care plan section) Acute Rehab PT Goals Patient Stated Goal: Unstated Progress towards PT goals: Progressing toward goals (slowly)    Frequency    Min 2X/week      PT Plan Current plan remains appropriate    Co-evaluation              AM-PAC PT "6 Clicks" Mobility   Outcome Measure  Help needed turning from your back to your side while in a flat bed without using bedrails?: A Lot Help needed moving from lying on your back to sitting on the side of a flat bed without using bedrails?: A Lot Help needed moving to and from a bed to a chair (including a wheelchair)?: A Lot Help needed standing up from a chair using your arms (e.g., wheelchair or bedside chair)?: A Lot Help needed to walk in hospital room?: A Little Help needed climbing 3-5 steps with a railing? : A Lot 6 Click Score: 13    End of Session Equipment Utilized During Treatment: Gait belt Activity Tolerance: Patient tolerated treatment well Patient left: in chair;with call bell/phone within reach;with chair alarm set Nurse Communication: Mobility status PT Visit Diagnosis: Other abnormalities of gait and mobility (R26.89);Muscle weakness (generalized) (M62.81)     Time: 9485-4627 PT Time Calculation (min) (ACUTE ONLY): 28 min  Charges:  $Gait Training: 8-22 mins $Therapeutic Activity: 8-22 mins                     Arlyss Gandy, PT, DPT Acute Rehabilitation Pager: [phone]    Arlyss Gandy 08/10/2020, 1:07 PM

## 2020-08-10 NOTE — Care Management Important Message (Signed)
Important Message  Patient Details  Name: Blake Lutz MRN: [id number] Date of Birth: 10-05-1945   Medicare Important Message Given:  Yes     Dorena Bodo 08/10/2020, 2:22 PM

## 2020-08-10 NOTE — TOC Progression Note (Addendum)
Transition of Care [facility]) - Progression Note    Patient Details  Name: Blake Lutz MRN: [id number] Date of Birth: [birth year]  [facility] CM/SW Contact  Beckie Busing, RN Phone Number: [phone]  08/10/2020, 9:47 AM  Clinical Narrative:    CM attempted to contact admissions coordinator Blake Lutz. On hold for 12 minutes. CM attempted to call back and phone rings numerous times and recording comes on to say no one is answering the call but no option to leave a message. CM attempting to determine is patient is able to return and if he will be allowed to return on the weekend. CM will attempt to call facility again at a later time.   1600 CM spoke with Blake Lutz at [facility]. Per Blake Lutz the patient can not return over the weekend. Facility does not accept admits on weekends. Blake Lutz is the Psychologist, counselling that took call for [name] since [name] is not available. Facility does request that CM call facility before sending patient back.         Expected Discharge Plan and Services                                                 Social Determinants of Health (SDOH) Interventions    Readmission Risk Interventions No flowsheet data found.

## 2020-08-10 NOTE — Progress Notes (Signed)
PROGRESS NOTE        PATIENT DETAILS Name: Blake Lutz Age: 75 y.o. Sex: male Date of Birth: [birth year] Admit Date: 08/05/2020 Admitting Physician Laqueta Due, DO GUR:KYHCWCB, Provider, MD  Brief Narrative: Patient is a 75 y.o. male dementia, HTN, HLD-who presented with vomiting-further evaluation revealed gastric outlet obstruction.  Significant events: 4/3>> admit from SNF-vomiting-found to have gastric outlet obstruction.  Significant studies: 4/3>> CT abdomen/pelvis: Gastric outlet obstruction.  Antimicrobial therapy: Rocephin: 4/3 x 1 Rocephin: 4/6 x1 Keflex: 4/7>>  Microbiology data: 4/3>> urine culture: E. coli  Procedures : 4/5>> EGD: Grade B esophagitis-from vomiting/NG trauma, gastritis, pylorus patent-no neoplasia/inflammation/stricture seen  Consults: GI  DVT Prophylaxis : SQ Lovenox  Subjective: No restraints since yesterday afternoon.  Pleasantly confused.  Assessment/Plan: Gastric outlet obstruction: Resolved-EGD without any tumor-pylorus was patent.  Suspicion that patient may have had limited inflammation from a viral illness-that may have showed up as gastric outlet obstruction on CT imaging.  Per nursing staff-tolerating diet without any issues.  No vomiting.  Esophagitis gastritis: Seen on EGD-thought to be due to either vomiting or NG trauma-continue PPI  Hypernatremia: Continue D5W-encourage oral intake-repeat electrolytes tomorrow.  Hypokalemia: Continue to replete and recheck  HTN: BP stable-continue beta-blocker-hold HCTZ.  HLD: Continue statin  E. coli UTI: Hard to tell whether patient is symptomatic given dementia-on Rocephin-has been transitioned to Keflex for a few more days.  Dementia with delirium: Mental status gradually improving-not on any restraints since yesterday afternoon-remains on Seroquel and Namenda.    Elevated total protein level on admission: Likely a lab error-protein level this  morning within normal limits this morning   Diet: Diet Order            DIET SOFT Room service appropriate? No; Fluid consistency: Thin  Diet effective now                  Code Status:  DNR  Family Communication: Spouse-Wilma-[phone]-left a voicemail on 4/7  Disposition Plan: Status is: Inpatient  Remains inpatient appropriate because:Inpatient level of care appropriate due to severity of illness   Dispo: The patient is from: SNF              Anticipated d/c is to: SNF              Patient currently is not medically stable to d/c.   Difficult to place patient No   Barriers to Discharge: Resolving delirium-remains on D5W for persistent hyponatremia.   Antimicrobial agents: Anti-infectives (From admission, onward)   Start     Dose/Rate Route Frequency Ordered Stop   08/09/20 1145  cephALEXin (KEFLEX) capsule 500 mg        500 mg Oral Every 12 hours 08/09/20 1047 08/13/20 0959   08/08/20 1400  cefTRIAXone (ROCEPHIN) 1 g in sodium chloride 0.9 % 100 mL IVPB  Status:  Discontinued        1 g 200 mL/hr over 30 Minutes Intravenous Every 24 hours 08/08/20 1216 08/09/20 1047   08/05/20 1145  cefTRIAXone (ROCEPHIN) 1 g in sodium chloride 0.9 % 100 mL IVPB        1 g 200 mL/hr over 30 Minutes Intravenous  Once 08/05/20 1138 08/05/20 1247       Time spent: 25- minutes-Greater than 50% of this time was spent in counseling, explanation of diagnosis, planning of further  management, and coordination of care.  MEDICATIONS: Scheduled Meds: . cephALEXin  500 mg Oral Q12H  . enoxaparin (LOVENOX) injection  40 mg Subcutaneous Q24H  . memantine  7.5 mg Oral BID  . metoprolol tartrate  25 mg Oral BID  . montelukast  10 mg Oral QHS  . pantoprazole  40 mg Oral QAC breakfast  . pravastatin  40 mg Oral QHS  . QUEtiapine  37.5 mg Oral Daily  . QUEtiapine  50 mg Oral QAC supper   Continuous Infusions: . dextrose 125 mL/hr at 08/10/20 0735   PRN Meds:.acetaminophen **OR**  acetaminophen, albuterol, haloperidol lactate   PHYSICAL EXAM: Vital signs: Vitals:   08/09/20 2117 08/10/20 0609 08/10/20 0859 08/10/20 1228  BP: 138/73 (!) 142/71 (!) 136/91 112/77  Pulse: 77 70 100 98  Resp: 20 20  15   Temp: 97.9 F (36.6 C) 98.2 F (36.8 C)  98 F (36.7 C)  TempSrc:    Oral  SpO2: 100% 100% 100% 100%   There were no vitals filed for this visit. There is no height or weight on file to calculate BMI.   Gen Exam: Confused but-not in any distress HEENT:atraumatic, normocephalic Chest: B/L clear to auscultation anteriorly CVS:S1S2 regular Abdomen:soft non tender, non distended Extremities:no edema Neurology: Non focal Skin: no rash  I have personally reviewed following labs and imaging studies  LABORATORY DATA: CBC: Recent Labs  Lab 08/05/20 0641 08/06/20 0430 08/07/20 0608  WBC 11.5* 9.6 6.8  HGB 12.4* 11.8* 12.3*  HCT 39.5 37.9* 39.4  MCV 80.6 81.7 81.9  PLT 424* 285 296    Basic Metabolic Panel: Recent Labs  Lab 08/06/20 0430 08/07/20 0608 08/08/20 0206 08/09/20 0128 08/10/20 0317  NA 151* 151* 152* 147* 147*  K 3.4* 3.0* 3.6 3.2* 3.1*  CL 116* 118* 119* 113* 114*  CO2 27 26 28 29 26   GLUCOSE 104* 140* 118* 151* 133*  BUN 12 7* 10 10 9   CREATININE 0.99 0.93 0.98 1.03 0.91  CALCIUM 9.8 10.2 10.1 9.7 9.3  MG 2.1  --  2.1  --  1.9  PHOS 3.2  --   --   --   --     GFR: CrCl cannot be calculated (Unknown ideal weight.).  Liver Function Tests: Recent Labs  Lab 08/05/20 0641 08/06/20 0430 08/07/20 0608  AST 55* 16 18  ALT 14 12 14   ALKPHOS 88 94 92  BILITOT 1.7* 0.7 0.9  PROT >12.0* 6.9 7.2  ALBUMIN 3.2* 3.1* 3.2*   Recent Labs  Lab 08/05/20 0641  LIPASE 40   No results for input(s): AMMONIA in the last 168 hours.  Coagulation Profile: No results for input(s): INR, PROTIME in the last 168 hours.  Cardiac Enzymes: No results for input(s): CKTOTAL, CKMB, CKMBINDEX, TROPONINI in the last 168 hours.  BNP (last 3  results) No results for input(s): PROBNP in the last 8760 hours.  Lipid Profile: No results for input(s): CHOL, HDL, LDLCALC, TRIG, CHOLHDL, LDLDIRECT in the last 72 hours.  Thyroid Function Tests: No results for input(s): TSH, T4TOTAL, FREET4, T3FREE, THYROIDAB in the last 72 hours.  Anemia Panel: No results for input(s): VITAMINB12, FOLATE, FERRITIN, TIBC, IRON, RETICCTPCT in the last 72 hours.  Urine analysis:    Component Value Date/Time   COLORURINE YELLOW 08/05/2020 1034   APPEARANCEUR CLOUDY (A) 08/05/2020 1034   LABSPEC 1.029 08/05/2020 1034   PHURINE 6.0 08/05/2020 1034   GLUCOSEU NEGATIVE 08/05/2020 1034   HGBUR MODERATE (A) 08/05/2020 1034  BILIRUBINUR NEGATIVE 08/05/2020 1034   KETONESUR NEGATIVE 08/05/2020 1034   PROTEINUR 30 (A) 08/05/2020 1034   NITRITE NEGATIVE 08/05/2020 1034   LEUKOCYTESUR LARGE (A) 08/05/2020 1034    Sepsis Labs: Lactic Acid, Venous No results found for: LATICACIDVEN  MICROBIOLOGY: Recent Results (from the past 240 hour(s))  Urine culture     Status: Abnormal   Collection Time: 08/05/20 10:34 AM   Specimen: Urine, Random  Result Value Ref Range Status   Specimen Description URINE, RANDOM  Final   Special Requests   Final    NONE Performed at [facility], [address] [place], [place] [address]    Culture >=100,000 COLONIES/mL ESCHERICHIA COLI (A)  Final   Report Status 08/07/2020 FINAL  Final   Organism ID, Bacteria ESCHERICHIA COLI (A)  Final      Susceptibility   Escherichia coli - MIC*    AMPICILLIN 8 SENSITIVE Sensitive     CEFAZOLIN <=4 SENSITIVE Sensitive     CEFEPIME <=0.12 SENSITIVE Sensitive     CEFTRIAXONE <=0.25 SENSITIVE Sensitive     CIPROFLOXACIN <=0.25 SENSITIVE Sensitive     GENTAMICIN <=1 SENSITIVE Sensitive     IMIPENEM <=0.25 SENSITIVE Sensitive     NITROFURANTOIN <=16 SENSITIVE Sensitive     TRIMETH/SULFA <=20 SENSITIVE Sensitive     AMPICILLIN/SULBACTAM <=2 SENSITIVE Sensitive     PIP/TAZO  <=4 SENSITIVE Sensitive     * >=100,000 COLONIES/mL ESCHERICHIA COLI  SARS CORONAVIRUS 2 (TAT 6-24 HRS) Nasopharyngeal Nasopharyngeal Swab     Status: None   Collection Time: 08/05/20 12:04 PM   Specimen: Nasopharyngeal Swab  Result Value Ref Range Status   SARS Coronavirus 2 NEGATIVE NEGATIVE Final    Comment: (NOTE) SARS-CoV-2 target nucleic acids are NOT DETECTED.  The SARS-CoV-2 RNA is generally detectable in upper and lower respiratory specimens during the acute phase of infection. Negative results do not preclude SARS-CoV-2 infection, do not rule out co-infections with other pathogens, and should not be used as the sole basis for treatment or other patient management decisions. Negative results must be combined with clinical observations, patient history, and epidemiological information. The expected result is Negative.  Fact Sheet for Patients: HairSlick.no  Fact Sheet for Healthcare Providers: quierodirigir.com  This test is not yet approved or cleared by the [place] FDA and  has been authorized for detection and/or diagnosis of SARS-CoV-2 by FDA under an Emergency Use Authorization (EUA). This EUA will remain  in effect (meaning this test can be used) for the duration of the COVID-19 declaration under Se ction 564(b)(1) of the Act, 21 U.S.C. section 360bbb-3(b)(1), unless the authorization is terminated or revoked sooner.  Performed at [facility], [address] [place], [place] [address]     RADIOLOGY STUDIES/RESULTS: No results found.   LOS: 5 days   Jeoffrey Massed, MD  [facility]    To contact the attending provider between 7A-7P or the covering provider during after hours 7P-7A, please log into the web site [email or web address] and access using universal [place] password for that web site. If you do not have the password, please call the hospital operator.  08/10/2020, 1:51 PM

## 2020-08-11 DIAGNOSIS — I10 Essential (primary) hypertension: Secondary | ICD-10-CM | POA: Diagnosis not present

## 2020-08-11 DIAGNOSIS — E870 Hyperosmolality and hypernatremia: Secondary | ICD-10-CM | POA: Diagnosis not present

## 2020-08-11 DIAGNOSIS — K311 Adult hypertrophic pyloric stenosis: Secondary | ICD-10-CM | POA: Diagnosis not present

## 2020-08-11 DIAGNOSIS — K219 Gastro-esophageal reflux disease without esophagitis: Secondary | ICD-10-CM | POA: Diagnosis not present

## 2020-08-11 LAB — BASIC METABOLIC PANEL
Anion gap: 5 (ref 5–15)
BUN: 9 mg/dL (ref 8–23)
CO2: 26 mmol/L (ref 22–32)
Calcium: 9.2 mg/dL (ref 8.9–10.3)
Chloride: 114 mmol/L — ABNORMAL HIGH (ref 98–111)
Creatinine, Ser: 0.8 mg/dL (ref 0.61–1.24)
GFR, Estimated: >60 mL/min (ref >60)
Glucose, Bld: 157 mg/dL — ABNORMAL HIGH (ref 70–99)
Potassium: 3.1 mmol/L — ABNORMAL LOW (ref 3.5–5.1)
Sodium: 145 mmol/L (ref 135–145)

## 2020-08-11 MED ORDER — MAGNESIUM SULFATE 2 GM/50ML IV SOLN
2.0000 g | Freq: Once | INTRAVENOUS | Status: AC
  Administered 2020-08-11: 2 g via INTRAVENOUS
  Filled 2020-08-11: qty 50

## 2020-08-11 MED ORDER — POTASSIUM CHLORIDE 20 MEQ PO PACK
40.0000 meq | PACK | ORAL | Status: AC
  Administered 2020-08-11 (×2): 40 meq via ORAL
  Filled 2020-08-11 (×2): qty 2

## 2020-08-11 NOTE — Progress Notes (Signed)
Occupational Therapy Treatment Patient Details Name: Reno Clasby MRN: [id number] DOB: [birth year] Today's Date: 08/11/2020    History of present illness Pt is a 75 y.o. male admitted from [facility] on 08/05/20 with vomiting. Abdominal CT revealed gastric outlet obstruction requiring NGT. S/p EGD 4/5 showing esophagitis. Course complicated by dementia with delirium. PMH includes dementia, HTN.   OT comments  Pt making steady progress towards OT goals this session. Pt continues to present with baseline cognitive deficits, impaired balance and decreased ability to care for self, however pt able to complete household distance functional mobility with Rw and MIN A +2 for safety as well as toileting in BR. Pt currently requires MOD A for UB ADLS and total A for LB ADLS. Pts wife present during session with pt responding very well to her. Pt would continue to benefit from skilled occupational therapy while admitted and after d/c to address the below listed limitations in order to improve overall functional mobility and facilitate independence with BADL participation. DC plan remains appropriate, will follow acutely per POC.     Follow Up Recommendations  SNF;Other (comment) (return to [facility], nursing facility)    Equipment Recommendations  None recommended by OT    Recommendations for Other Services      Precautions / Restrictions Precautions Precautions: Fall;Other (comment) Precaution Comments: Bladder/ bowel incontinence Restrictions Weight Bearing Restrictions: No       Mobility Bed Mobility Overal bed mobility: Needs Assistance Bed Mobility: Supine to Sit;Sit to Supine     Supine to sit: Mod assist;HOB elevated Sit to supine: Min assist;HOB elevated   General bed mobility comments: MOD A needed for pt to initiate moving to EOB but able to elevate trunk and scoot forward with tactile cues and increased time, MIN A needed to return to supine to support BLES back to bed  and lower trunk back to supine    Transfers Overall transfer level: Needs assistance Equipment used: Rolling walker (2 wheeled);1 person hand held assist Transfers: Sit to/from Stand Sit to Stand: Mod assist;From elevated surface         General transfer comment: pt completed x3 sit<>stands from EOB and toilet with pt needing MOD A to rise into standing, use of momentum and rocking forward seemed to help    Balance Overall balance assessment: Needs assistance Sitting-balance support: Single extremity supported;Feet supported Sitting balance-Leahy Scale: Poor Sitting balance - Comments: close min guard for safety   Standing balance support: Bilateral upper extremity supported;During functional activity;Single extremity supported Standing balance-Leahy Scale: Poor Standing balance comment: at least one UE supported during ADLs and functional mobility                           ADL either performed or assessed with clinical judgement   ADL Overall ADL's : Needs assistance/impaired Eating/Feeding: Supervision/ safety;Set up;Sitting Eating/Feeding Details (indicate cue type and reason): observed pt self feeding from long sitting in bed with set- up assist from wife         Lower Body Bathing: Total assistance;Sit to/from stand Lower Body Bathing Details (indicate cue type and reason): simulated via posterior pericare Upper Body Dressing : Moderate assistance;Sitting Upper Body Dressing Details (indicate cue type and reason): to don posterior gown     Toilet Transfer: Minimal assistance;+2 for safety/equipment;RW;Ambulation Toilet Transfer Details (indicate cue type and reason): pt able to ambulate into BR and into hallway with MINA +2 for safety needing MAX tactile cues for spatial  awareness and sequencing of mobiltiy tasks Toileting- Clothing Manipulation and Hygiene: Total assistance;Sit to/from stand Toileting - Clothing Manipulation Details (indicate cue type and  reason): total A for posterior pericare in standing     Functional mobility during ADLs: Minimal assistance;Rolling walker;+2 for safety/equipment General ADL Comments: pt continues to present with impaired cognition but able to complete functional mobiltiy into hallway and into BR for toileting     Vision       Perception     Praxis      Cognition Arousal/Alertness: Awake/alert Behavior During Therapy: Flat affect Overall Cognitive Status: History of cognitive impairments - at baseline                                 General Comments: baseline with dementia, pt inconsistently follows one step commands but benefits from tactile cues and short, simple commands. wife present during session with pt responding well to her        Exercises     Shoulder Instructions       General Comments pts wife present during session , very supportive and helpful    Pertinent Vitals/ Pain       Pain Assessment: Faces Faces Pain Scale: No hurt  Home Living                                          Prior Functioning/Environment              Frequency  Min 2X/week        Progress Toward Goals  OT Goals(current goals can now be found in the care plan section)  Progress towards OT goals: Progressing toward goals  Acute Rehab OT Goals OT Goal Formulation: Patient unable to participate in goal setting Time For Goal Achievement: 08/21/20 Potential to Achieve Goals: Good  Plan Discharge plan remains appropriate;Frequency remains appropriate    Co-evaluation                 AM-PAC OT "6 Clicks" Daily Activity     Outcome Measure   Help from another person eating meals?: A Little (set- up) Help from another person taking care of personal grooming?: A Little Help from another person toileting, which includes using toliet, bedpan, or urinal?: A Lot Help from another person bathing (including washing, rinsing, drying)?: A Lot Help from  another person to put on and taking off regular upper body clothing?: A Lot Help from another person to put on and taking off regular lower body clothing?: Total 6 Click Score: 13    End of Session Equipment Utilized During Treatment: Gait belt;Rolling walker  OT Visit Diagnosis: Unsteadiness on feet (R26.81);Other abnormalities of gait and mobility (R26.89);Muscle weakness (generalized) (M62.81)   Activity Tolerance Patient tolerated treatment well   Patient Left in bed;with call bell/phone within reach;with bed alarm set;with nursing/sitter in room;with family/visitor present   Nurse Communication Mobility status        Time: 3419-3790 OT Time Calculation (min): 35 min  Charges: OT General Charges $OT Visit: 1 Visit OT Treatments $Self Care/Home Management : 23-37 mins  Lenor Derrick., COTA/L Acute Rehabilitation Services [phone]    Barron Schmid 08/11/2020, 4:09 PM

## 2020-08-11 NOTE — Progress Notes (Signed)
PROGRESS NOTE        PATIENT DETAILS Name: Blake Lutz Age: 75 y.o. Sex: male Date of Birth: 1946-01-11 Admit Date: 08/05/2020 Admitting Physician Laqueta Due, DO IRS:WNIOEVO, Provider, MD  Brief Narrative: Patient is a 75 y.o. male dementia, HTN, HLD-who presented with vomiting-further evaluation revealed gastric outlet obstruction.  Significant events: 4/3>> admit from SNF-vomiting-found to have gastric outlet obstruction.  Significant studies: 4/3>> CT abdomen/pelvis: Gastric outlet obstruction.  Antimicrobial therapy: Rocephin: 4/3 x 1 Rocephin: 4/6 x1 Keflex: 4/7>>  Microbiology data: 4/3>> urine culture: E. 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  No other issues overnight.  Assessment/Plan: Gastric outlet obstruction: Resolved-EGD without any tumor-pylorus was patent.  Suspicion that patient may have had limited inflammation from a viral illness-that may have showed up as gastric outlet obstruction on CT imaging.  Per nursing staff-tolerating diet without any issues.  No vomiting.  Esophagitis gastritis: Seen on EGD-thought to be due to either vomiting or NG trauma-continue PPI  Hypernatremia: Resolved with D5W.  Hypokalemia: Continue to replete.  HTN: BP stable-continue beta-blocker-hold HCTZ.  HLD: Continue statin  E. coli UTI: Hard to tell whether patient is symptomatic given dementia-on Rocephin-has been transitioned to Keflex for a few more days.  Dementia with delirium: We will continue to have delirium during this hospital stay-he is outside of his familiar surroundings-but mental status has gradually improved.  No longer with restraints-we will attempt to discontinue  safety sitter today.  Remains on Seroquel and Namenda.    Elevated total protein level on admission: Likely a lab error-protein level this morning within normal limits this morning   Diet: Diet Order            DIET SOFT Room service appropriate? No; Fluid consistency: Thin  Diet effective now                  Code Status:  DNR  Family Communication: Spouse-[phone]-left a voicemail on 4/8, 4/9  Disposition Plan: Status is: Inpatient  Remains inpatient appropriate because:Inpatient level of care appropriate due to severity of illness   Dispo: The patient is from: SNF              Anticipated d/c is to: SNF              Patient currently is not medically stable to d/c.   Difficult to place patient No   Barriers to Discharge: Resolving delirium-still with bedside sitter-replacing potassium.  Facility not able to take patient over the weekend-so possible discharge on 4/11.  Antimicrobial agents: Anti-infectives (From admission, onward)   Start     Dose/Rate Route Frequency Ordered Stop   08/09/20 1145  cephALEXin (KEFLEX) capsule 500 mg        500 mg Oral Every 12 hours 08/09/20 1047 08/13/20 0959   08/08/20 1400  cefTRIAXone (ROCEPHIN) 1 g in sodium chloride 0.9 % 100 mL IVPB  Status:  Discontinued        1 g 200 mL/hr over 30 Minutes Intravenous Every 24 hours 08/08/20 1216 08/09/20 1047   08/05/20 1145  cefTRIAXone (ROCEPHIN) 1 g in sodium chloride 0.9 % 100 mL IVPB  1 g 200 mL/hr over 30 Minutes Intravenous  Once 08/05/20 1138 08/05/20 1247       Time spent: 25- minutes-Greater than 50% of this time was spent in counseling, explanation of diagnosis, planning of further management, and coordination of care.  MEDICATIONS: Scheduled Meds: . cephALEXin  500 mg Oral Q12H  . enoxaparin (LOVENOX) injection  40 mg Subcutaneous Q24H  . memantine  7.5 mg Oral BID  . metoprolol tartrate  25 mg Oral BID  . montelukast  10 mg Oral QHS  . pantoprazole   40 mg Oral QAC breakfast  . potassium chloride  40 mEq Oral Q4H  . pravastatin  40 mg Oral QHS  . QUEtiapine  37.5 mg Oral Daily  . QUEtiapine  50 mg Oral QAC supper   Continuous Infusions:  PRN Meds:.acetaminophen **OR** acetaminophen, albuterol, haloperidol lactate   PHYSICAL EXAM: Vital signs: Vitals:   08/10/20 0859 08/10/20 1228 08/10/20 2153 08/11/20 0614  BP: (!) 136/91 112/77 (!) 158/86 (!) 148/78  Pulse: 100 98 78 68  Resp:  15 18 17   Temp:  98 F (36.7 C) 98.1 F (36.7 C) 97.9 F (36.6 C)  TempSrc:  Oral Oral Oral  SpO2: 100% 100% 100% 100%   There were no vitals filed for this visit. There is no height or weight on file to calculate BMI.   Gen Exam: Confused-not in any distress HEENT:atraumatic, normocephalic Chest: B/L clear to auscultation anteriorly CVS:S1S2 regular Abdomen:soft non tender, non distended Extremities:no edema Neurology: Non focal Skin: no rash  I have personally reviewed following labs and imaging studies  LABORATORY DATA: CBC: Recent Labs  Lab 08/05/20 0641 08/06/20 0430 08/07/20 0608  WBC 11.5* 9.6 6.8  HGB 12.4* 11.8* 12.3*  HCT 39.5 37.9* 39.4  MCV 80.6 81.7 81.9  PLT 424* 285 296    Basic Metabolic Panel: Recent Labs  Lab 08/06/20 0430 08/07/20 0608 08/08/20 0206 08/09/20 0128 08/10/20 0317 08/11/20 0201  NA 151* 151* 152* 147* 147* 145  K 3.4* 3.0* 3.6 3.2* 3.1* 3.1*  CL 116* 118* 119* 113* 114* 114*  CO2 27 26 28 29 26 26   GLUCOSE 104* 140* 118* 151* 133* 157*  BUN 12 7* 10 10 9 9   CREATININE 0.99 0.93 0.98 1.03 0.91 0.80  CALCIUM 9.8 10.2 10.1 9.7 9.3 9.2  MG 2.1  --  2.1  --  1.9  --   PHOS 3.2  --   --   --   --   --     GFR: CrCl cannot be calculated (Unknown ideal weight.).  Liver Function Tests: Recent Labs  Lab 08/05/20 0641 08/06/20 0430 08/07/20 0608  AST 55* 16 18  ALT 14 12 14   ALKPHOS 88 94 92  BILITOT 1.7* 0.7 0.9  PROT >12.0* 6.9 7.2  ALBUMIN 3.2* 3.1* 3.2*   Recent Labs  Lab  08/05/20 0641  LIPASE 40   No results for input(s): AMMONIA in the last 168 hours.  Coagulation Profile: No results for input(s): INR, PROTIME in the last 168 hours.  Cardiac Enzymes: No results for input(s): CKTOTAL, CKMB, CKMBINDEX, TROPONINI in the last 168 hours.  BNP (last 3 results) No results for input(s): PROBNP in the last 8760 hours.  Lipid Profile: No results for input(s): CHOL, HDL, LDLCALC, TRIG, CHOLHDL, LDLDIRECT in the last 72 hours.  Thyroid Function Tests: No results for input(s): TSH, T4TOTAL, FREET4, T3FREE, THYROIDAB in the last 72 hours.  Anemia Panel: No results for input(s): VITAMINB12, FOLATE, FERRITIN,  TIBC, IRON, RETICCTPCT in the last 72 hours.  Urine analysis:    Component Value Date/Time   COLORURINE YELLOW 08/05/2020 1034   APPEARANCEUR CLOUDY (A) 08/05/2020 1034   LABSPEC 1.029 08/05/2020 1034   PHURINE 6.0 08/05/2020 1034   GLUCOSEU NEGATIVE 08/05/2020 1034   HGBUR MODERATE (A) 08/05/2020 1034   BILIRUBINUR NEGATIVE 08/05/2020 1034   KETONESUR NEGATIVE 08/05/2020 1034   PROTEINUR 30 (A) 08/05/2020 1034   NITRITE NEGATIVE 08/05/2020 1034   LEUKOCYTESUR LARGE (A) 08/05/2020 1034    Sepsis Labs: Lactic Acid, Venous No results found for: LATICACIDVEN  MICROBIOLOGY: Recent Results (from the past 240 hour(s))  Urine culture     Status: Abnormal   Collection Time: 08/05/20 10:34 AM   Specimen: Urine, Random  Result Value Ref Range Status   Specimen Description URINE, RANDOM  Final   Special Requests   Final    NONE Performed at [facility], [address], [place], [place] [address]    Culture >=100,000 COLONIES/mL ESCHERICHIA COLI (A)  Final   Report Status 08/07/2020 FINAL  Final   Organism ID, Bacteria ESCHERICHIA COLI (A)  Final      Susceptibility   Escherichia coli - MIC*    AMPICILLIN 8 SENSITIVE Sensitive     CEFAZOLIN <=4 SENSITIVE Sensitive     CEFEPIME <=0.12 SENSITIVE Sensitive     CEFTRIAXONE <=0.25 SENSITIVE  Sensitive     CIPROFLOXACIN <=0.25 SENSITIVE Sensitive     GENTAMICIN <=1 SENSITIVE Sensitive     IMIPENEM <=0.25 SENSITIVE Sensitive     NITROFURANTOIN <=16 SENSITIVE Sensitive     TRIMETH/SULFA <=20 SENSITIVE Sensitive     AMPICILLIN/SULBACTAM <=2 SENSITIVE Sensitive     PIP/TAZO <=4 SENSITIVE Sensitive     * >=100,000 COLONIES/mL ESCHERICHIA COLI  SARS CORONAVIRUS 2 (TAT 6-24 HRS) Nasopharyngeal Nasopharyngeal Swab     Status: None   Collection Time: 08/05/20 12:04 PM   Specimen: Nasopharyngeal Swab  Result Value Ref Range Status   SARS Coronavirus 2 NEGATIVE NEGATIVE Final    Comment: (NOTE) SARS-CoV-2 target nucleic acids are NOT DETECTED.  The SARS-CoV-2 RNA is generally detectable in upper and lower respiratory specimens during the acute phase of infection. Negative results do not preclude SARS-CoV-2 infection, do not rule out co-infections with other pathogens, and should not be used as the sole basis for treatment or other patient management decisions. Negative results must be combined with clinical observations, patient history, and epidemiological information. The expected result is Negative.  Fact Sheet for Patients: HairSlick.no  Fact Sheet for Healthcare Providers: quierodirigir.com  This test is not yet approved or cleared by the [place] FDA and  has been authorized for detection and/or diagnosis of SARS-CoV-2 by FDA under an Emergency Use Authorization (EUA). This EUA will remain  in effect (meaning this test can be used) for the duration of the COVID-19 declaration under Se ction 564(b)(1) of the Act, 21 U.S.C. section 360bbb-3(b)(1), unless the authorization is terminated or revoked sooner.  Performed at [facility], [address] [place], [place] [address]     RADIOLOGY STUDIES/RESULTS: No results found.   LOS: 6 days   Jeoffrey Massed, MD  [facility]    To contact the  attending provider between 7A-7P or the covering provider during after hours 7P-7A, please log into the web site [email or web address] and access using universal [place] password for that web site. If you do not have the password, please call the hospital operator.  08/11/2020,  9:56 AM

## 2020-08-12 DIAGNOSIS — K311 Adult hypertrophic pyloric stenosis: Secondary | ICD-10-CM | POA: Diagnosis not present

## 2020-08-12 LAB — MAGNESIUM: Magnesium: 2.1 mg/dL (ref 1.7–2.4)

## 2020-08-12 LAB — BASIC METABOLIC PANEL
Anion gap: 1 — ABNORMAL LOW (ref 5–15)
BUN: 12 mg/dL (ref 8–23)
CO2: 32 mmol/L (ref 22–32)
Calcium: 9.5 mg/dL (ref 8.9–10.3)
Chloride: 116 mmol/L — ABNORMAL HIGH (ref 98–111)
Creatinine, Ser: 1.02 mg/dL (ref 0.61–1.24)
GFR, Estimated: >60 mL/min (ref >60)
Glucose, Bld: 112 mg/dL — ABNORMAL HIGH (ref 70–99)
Potassium: 3.9 mmol/L (ref 3.5–5.1)
Sodium: 149 mmol/L — ABNORMAL HIGH (ref 135–145)

## 2020-08-12 LAB — SARS CORONAVIRUS 2 (TAT 6-24 HRS): SARS Coronavirus 2: NEGATIVE

## 2020-08-12 MED ORDER — DEXTROSE 5 % IV SOLN: INTRAVENOUS | Status: DC

## 2020-08-12 NOTE — Plan of Care (Signed)

## 2020-08-12 NOTE — Progress Notes (Addendum)
PROGRESS NOTE        PATIENT DETAILS Name: Blake Lutz Age: 75 y.o. Sex: male Date of Birth: 1945-11-30 Admit Date: 08/05/2020 Admitting Physician Laqueta Due, DO IOX:[id number], Provider, MD  Brief Narrative: Patient is a 75 y.o. male dementia, HTN, HLD-who presented with vomiting-further evaluation revealed gastric outlet obstruction.  Significant events: 4/3>> admit from SNF-vomiting-found to have gastric outlet obstruction.  Significant studies: 4/3>> CT abdomen/pelvis: Gastric outlet obstruction.  Antimicrobial therapy: Rocephin: 4/3 x 1 Rocephin: 4/6 x1 Keflex: 4/7>>  Microbiology data: 4/3>> urine culture: E. coli  Procedures : 4/5>> EGD: Grade B esophagitis-from vomiting/NG trauma, gastritis, pylorus patent-no neoplasia/inflammation/stricture seen  Consults: GI  DVT Prophylaxis : SQ Lovenox  Subjective:  Patient in bed, appears comfortable however is mildly confused, denies any headache, no fever, no chest pain or pressure, no shortness of breath , no abdominal pain. No new focal weakness.   Assessment/Plan:  Addendum - paged at 5.40 pm 08/12/20 - patient pocketing foods and Meds, NPO except Meds, IVF, SLP re eval in am.  Gastric outlet obstruction: Resolved-EGD without any tumor-pylorus was patent.  Suspicion that patient may have had limited inflammation from a viral illness-that may have showed up as gastric outlet obstruction on CT imaging.  Per nursing staff-tolerating diet without any issues.  No vomiting.  Esophagitis gastritis: Seen on EGD-thought to be due to either vomiting or NG trauma-continue PPI  Hypernatremia: Resolved with D5W.  Hypokalemia: Placed, will recheck potassium and magnesium on 08/12/2020 and address as needed  HTN: BP stable-continue beta-blocker-hold HCTZ.  HLD: Continue statin  E. coli UTI: Hard to tell whether patient is symptomatic given dementia-on Rocephin-has been transitioned to Keflex,  finishing his course on 08/12/2020 evening.  Dementia with delirium (metabolic encephalopathy): We will continue to have delirium during this hospital stay-he is outside of his familiar surroundings-but mental status has gradually improved.  No longer with restraints-we will attempt to discontinue safety sitter today.  Remains on Seroquel and Namenda.    Elevated total protein level on admission: Likely a lab error-protein level this morning within normal limits this morning   Diet: Diet Order            DIET SOFT Room service appropriate? No; Fluid consistency: Thin  Diet effective now                  Code Status:  DNR  Family Communication: Spouse-Blake Lutz-[phone]-left a voicemail on 4/8, 4/9  Disposition Plan: Status is: Inpatient  Remains inpatient appropriate because:Inpatient level of care appropriate due to severity of illness   Dispo: The patient is from: SNF              Anticipated d/c is to: SNF              Patient currently is not medically stable to d/c.   Difficult to place patient No   Barriers to Discharge: Resolving delirium-still with bedside sitter-replacing potassium.  Facility not able to take patient over the weekend-so possible discharge on 4/11.  Antimicrobial agents: Anti-infectives (From admission, onward)   Start     Dose/Rate Route Frequency Ordered Stop   08/09/20 1145  cephALEXin (KEFLEX) capsule 500 mg        500 mg Oral Every 12 hours 08/09/20 1047 08/13/20 0959   08/08/20 1400  cefTRIAXone (ROCEPHIN) 1 g in sodium chloride  0.9 % 100 mL IVPB  Status:  Discontinued        1 g 200 mL/hr over 30 Minutes Intravenous Every 24 hours 08/08/20 1216 08/09/20 1047   08/05/20 1145  cefTRIAXone (ROCEPHIN) 1 g in sodium chloride 0.9 % 100 mL IVPB        1 g 200 mL/hr over 30 Minutes Intravenous  Once 08/05/20 1138 08/05/20 1247       Time spent: 25- minutes-Greater than 50% of this time was spent in counseling, explanation of diagnosis,  planning of further management, and coordination of care.  MEDICATIONS: Scheduled Meds: . cephALEXin  500 mg Oral Q12H  . enoxaparin (LOVENOX) injection  40 mg Subcutaneous Q24H  . memantine  7.5 mg Oral BID  . metoprolol tartrate  25 mg Oral BID  . montelukast  10 mg Oral QHS  . pantoprazole  40 mg Oral QAC breakfast  . pravastatin  40 mg Oral QHS  . QUEtiapine  37.5 mg Oral Daily  . QUEtiapine  50 mg Oral QAC supper   Continuous Infusions:  PRN Meds:.acetaminophen **OR** acetaminophen, albuterol, haloperidol lactate   PHYSICAL EXAM: Vital signs: Vitals:   08/11/20 0614 08/11/20 1249 08/11/20 2147 08/12/20 0702  BP: (!) 148/78 125/74 (!) 104/58 104/61  Pulse: 68 93 79 72  Resp: 17  16 16   Temp: 97.9 F (36.6 C) 98.8 F (37.1 C) 98.7 F (37.1 C) 97.6 F (36.4 C)  TempSrc: Oral  Axillary Oral  SpO2: 100% 100% 99% 100%   There were no vitals filed for this visit. There is no height or weight on file to calculate BMI.   Gen Exam:   Awake, pleasantly confused, moves all 4 extremities to commands, [place].AT,PERRAL Supple Neck,No JVD, No cervical lymphadenopathy appriciated.  Symmetrical Chest wall movement, Good air movement bilaterally, CTAB RRR,No Gallops, Rubs or new Murmurs, No Parasternal Heave +ve B.Sounds, Abd Soft, No tenderness, No organomegaly appriciated, No rebound - guarding or rigidity. No Cyanosis, Clubbing or edema, No new Rash or bruise  I have personally reviewed following labs and imaging studies  LABORATORY DATA: CBC: Recent Labs  Lab 08/06/20 0430 08/07/20 0608  WBC 9.6 6.8  HGB 11.8* 12.3*  HCT 37.9* 39.4  MCV 81.7 81.9  PLT 285 296    Basic Metabolic Panel: Recent Labs  Lab 08/06/20 0430 08/07/20 0608 08/08/20 0206 08/09/20 0128 08/10/20 0317 08/11/20 0201  NA 151* 151* 152* 147* 147* 145  K 3.4* 3.0* 3.6 3.2* 3.1* 3.1*  CL 116* 118* 119* 113* 114* 114*  CO2 27 26 28 29 26 26   GLUCOSE 104* 140* 118* 151* 133* 157*  BUN 12 7* 10  10 9 9   CREATININE 0.99 0.93 0.98 1.03 0.91 0.80  CALCIUM 9.8 10.2 10.1 9.7 9.3 9.2  MG 2.1  --  2.1  --  1.9  --   PHOS 3.2  --   --   --   --   --     GFR: CrCl cannot be calculated (Unknown ideal weight.).  Liver Function Tests: Recent Labs  Lab 08/06/20 0430 08/07/20 0608  AST 16 18  ALT 12 14  ALKPHOS 94 92  BILITOT 0.7 0.9  PROT 6.9 7.2  ALBUMIN 3.1* 3.2*   No results for input(s): LIPASE, AMYLASE in the last 168 hours. No results for input(s): AMMONIA in the last 168 hours.  Coagulation Profile: No results for input(s): INR, PROTIME in the last 168 hours.  Cardiac Enzymes: No results for input(s): CKTOTAL,  CKMB, CKMBINDEX, TROPONINI in the last 168 hours.  BNP (last 3 results) No results for input(s): PROBNP in the last 8760 hours.  Lipid Profile: No results for input(s): CHOL, HDL, LDLCALC, TRIG, CHOLHDL, LDLDIRECT in the last 72 hours.  Thyroid Function Tests: No results for input(s): TSH, T4TOTAL, FREET4, T3FREE, THYROIDAB in the last 72 hours.  Anemia Panel: No results for input(s): VITAMINB12, FOLATE, FERRITIN, TIBC, IRON, RETICCTPCT in the last 72 hours.  Urine analysis:    Component Value Date/Time   COLORURINE YELLOW 08/05/2020 1034   APPEARANCEUR CLOUDY (A) 08/05/2020 1034   LABSPEC 1.029 08/05/2020 1034   PHURINE 6.0 08/05/2020 1034   GLUCOSEU NEGATIVE 08/05/2020 1034   HGBUR MODERATE (A) 08/05/2020 1034   BILIRUBINUR NEGATIVE 08/05/2020 1034   KETONESUR NEGATIVE 08/05/2020 1034   PROTEINUR 30 (A) 08/05/2020 1034   NITRITE NEGATIVE 08/05/2020 1034   LEUKOCYTESUR LARGE (A) 08/05/2020 1034    Sepsis Labs: Lactic Acid, Venous No results found for: LATICACIDVEN  MICROBIOLOGY: Recent Results (from the past 240 hour(s))  Urine culture     Status: Abnormal   Collection Time: 08/05/20 10:34 AM   Specimen: Urine, Random  Result Value Ref Range Status   Specimen Description URINE, RANDOM  Final   Special Requests   Final    NONE Performed  at [facility], [address], [place], [place] [address]    Culture >=100,000 COLONIES/mL ESCHERICHIA COLI (A)  Final   Report Status 08/07/2020 FINAL  Final   Organism ID, Bacteria ESCHERICHIA COLI (A)  Final      Susceptibility   Escherichia coli - MIC*    AMPICILLIN 8 SENSITIVE Sensitive     CEFAZOLIN <=4 SENSITIVE Sensitive     CEFEPIME <=0.12 SENSITIVE Sensitive     CEFTRIAXONE <=0.25 SENSITIVE Sensitive     CIPROFLOXACIN <=0.25 SENSITIVE Sensitive     GENTAMICIN <=1 SENSITIVE Sensitive     IMIPENEM <=0.25 SENSITIVE Sensitive     NITROFURANTOIN <=16 SENSITIVE Sensitive     TRIMETH/SULFA <=20 SENSITIVE Sensitive     AMPICILLIN/SULBACTAM <=2 SENSITIVE Sensitive     PIP/TAZO <=4 SENSITIVE Sensitive     * >=100,000 COLONIES/mL ESCHERICHIA COLI  SARS CORONAVIRUS 2 (TAT 6-24 HRS) Nasopharyngeal Nasopharyngeal Swab     Status: None   Collection Time: 08/05/20 12:04 PM   Specimen: Nasopharyngeal Swab  Result Value Ref Range Status   SARS Coronavirus 2 NEGATIVE NEGATIVE Final    Comment: (NOTE) SARS-CoV-2 target nucleic acids are NOT DETECTED.  The SARS-CoV-2 RNA is generally detectable in upper and lower respiratory specimens during the acute phase of infection. Negative results do not preclude SARS-CoV-2 infection, do not rule out co-infections with other pathogens, and should not be used as the sole basis for treatment or other patient management decisions. Negative results must be combined with clinical observations, patient history, and epidemiological information. The expected result is Negative.  Fact Sheet for Patients: HairSlick.no  Fact Sheet for Healthcare Providers: quierodirigir.com  This test is not yet approved or cleared by the [place] FDA and  has been authorized for detection and/or diagnosis of SARS-CoV-2 by FDA under an Emergency Use Authorization (EUA). This EUA will remain  in effect  (meaning this test can be used) for the duration of the COVID-19 declaration under Se ction 564(b)(1) of the Act, 21 U.S.C. section 360bbb-3(b)(1), unless the authorization is terminated or revoked sooner.  Performed at [facility], [address] [place], [place] [address]  RADIOLOGY STUDIES/RESULTS: No results found.   LOS: 7 days   Susa RaringPrashant Thula Stewart, MD  [facility]  08/12/2020, 8:55 AM

## 2020-08-12 NOTE — Progress Notes (Signed)
Pt noted pocketing meds and food this  Am. Assisted pt.

## 2020-08-13 ENCOUNTER — Inpatient Hospital Stay (HOSPITAL_COMMUNITY): Payer: Medicare PPO

## 2020-08-13 DIAGNOSIS — K311 Adult hypertrophic pyloric stenosis: Secondary | ICD-10-CM | POA: Diagnosis not present

## 2020-08-13 LAB — BASIC METABOLIC PANEL
Anion gap: 3 — ABNORMAL LOW (ref 5–15)
BUN: 11 mg/dL (ref 8–23)
CO2: 29 mmol/L (ref 22–32)
Calcium: 9.6 mg/dL (ref 8.9–10.3)
Chloride: 114 mmol/L — ABNORMAL HIGH (ref 98–111)
Creatinine, Ser: 0.95 mg/dL (ref 0.61–1.24)
GFR, Estimated: >60 mL/min (ref >60)
Glucose, Bld: 123 mg/dL — ABNORMAL HIGH (ref 70–99)
Potassium: 3.4 mmol/L — ABNORMAL LOW (ref 3.5–5.1)
Sodium: 146 mmol/L — ABNORMAL HIGH (ref 135–145)

## 2020-08-13 LAB — MAGNESIUM: Magnesium: 2.1 mg/dL (ref 1.7–2.4)

## 2020-08-13 MED ORDER — QUETIAPINE FUMARATE 50 MG PO TABS
50.0000 mg | ORAL_TABLET | Freq: Once | ORAL | Status: AC
  Administered 2020-08-13: 50 mg via ORAL
  Filled 2020-08-13: qty 1

## 2020-08-13 MED ORDER — POTASSIUM CHLORIDE 2 MEQ/ML IV SOLN
INTRAVENOUS | Status: AC
  Filled 2020-08-13 (×2): qty 1000

## 2020-08-13 MED ORDER — HALOPERIDOL LACTATE 5 MG/ML IJ SOLN
5.0000 mg | Freq: Once | INTRAMUSCULAR | Status: AC
  Administered 2020-08-13: 5 mg via INTRAMUSCULAR
  Filled 2020-08-13: qty 1

## 2020-08-13 NOTE — Progress Notes (Signed)
PROGRESS NOTE        PATIENT DETAILS Name: Blake Lutz Age: 75 y.o. Sex: male Date of Birth: 11-18-1945 Admit Date: 08/05/2020 Admitting Physician Laqueta Due, DO JDB:[id number], Provider, MD  Brief Narrative: Patient is a 75 y.o. male dementia, HTN, HLD-who presented with vomiting-further evaluation revealed gastric outlet obstruction.  Significant events: 4/3>> admit from SNF-vomiting-found to have gastric outlet obstruction.  Significant studies: 4/3>> CT abdomen/pelvis: Gastric outlet obstruction.  Antimicrobial therapy: Rocephin: 4/3 x 1 Rocephin: 4/6 x1 Keflex: 4/7>>  Microbiology data: 4/3>> urine culture: E. coli  Procedures : 4/5>> EGD: Grade B esophagitis-from vomiting/NG trauma, gastritis, pylorus patent-no neoplasia/inflammation/stricture seen  Consults: GI, Pall Care  DVT Prophylaxis : SQ Lovenox    Subjective:  Patient in chair mildly confused but appears comfortable, denies any headache, no fever, no chest pain or pressure, no shortness of breath , no abdominal pain. No new focal weakness.  Assessment/Plan:  Gastric outlet obstruction: Resolved-EGD without any tumor-pylorus was patent.  Suspicion that patient may have had limited inflammation from a viral illness-that may have showed up as gastric outlet obstruction on CT imaging.  Per nursing staff-tolerating diet without any issues.  No vomiting.  Esophagitis gastritis: Seen on EGD-thought to be due to either vomiting or NG trauma-continue PPI  Dysphagia - pocketing food and meds underlying advanced dementia, for now NPO except Meds, IVF, SLP input, DW wife, [facility] Care consulted for future plan of care, if significant decline wife open to Hospice.  Hypernatremia: placed on IV D5W.  Hypokalemia: replaced IV  HTN: BP stable-continue beta-blocker-hold HCTZ.  HLD: Continue statin  E. coli UTI: Hard to tell whether patient is symptomatic given dementia-on Rocephin-has  been transitioned to Keflex, finishing his course on 08/12/2020 evening.  Dementia with delirium (metabolic encephalopathy): We will continue to have delirium during this hospital stay-he is outside of his familiar surroundings-but mental status has gradually improved.  No longer with restraints-we will attempt to discontinue safety sitter today.  Remains on Seroquel and Namenda.    Elevated total protein level on admission: Likely a lab error-protein level this morning within normal limits this morning   Diet: Diet Order            Diet NPO time specified Except for: Sips with Meds  Diet effective now                  Code Status:  DNR  Family Communication: Spouse-Wilma-[phone]-left a voicemail on 4/8, 4/9, 08/13/20  Disposition Plan: Status is: Inpatient  Remains inpatient appropriate because:Inpatient level of care appropriate due to severity of illness   Dispo: The patient is from: SNF              Anticipated d/c is to: SNF              Patient currently is not medically stable to d/c.   Difficult to place patient No   Barriers to Discharge: Resolving delirium-still with bedside sitter-replacing potassium.  Facility not able to take patient over the weekend-so possible discharge on 4/11.  Antimicrobial agents:  Anti-infectives (From admission, onward)   Start     Dose/Rate Route Frequency Ordered Stop   08/09/20 1145  cephALEXin (KEFLEX) capsule 500 mg        500 mg Oral Every 12 hours 08/09/20 1047 08/12/20 2043   08/08/20 1400  cefTRIAXone (ROCEPHIN) 1 g in sodium chloride 0.9 % 100 mL IVPB  Status:  Discontinued        1 g 200 mL/hr over 30 Minutes Intravenous Every 24 hours 08/08/20 1216 08/09/20 1047   08/05/20 1145  cefTRIAXone (ROCEPHIN) 1 g in sodium chloride 0.9 % 100 mL IVPB        1 g 200 mL/hr over 30 Minutes Intravenous  Once 08/05/20 1138 08/05/20 1247       Time spent: 25- minutes-Greater than 50% of this time was spent in counseling,  explanation of diagnosis, planning of further management, and coordination of care.  MEDICATIONS:  Scheduled Meds: . enoxaparin (LOVENOX) injection  40 mg Subcutaneous Q24H  . memantine  7.5 mg Oral BID  . metoprolol tartrate  25 mg Oral BID  . montelukast  10 mg Oral QHS  . pantoprazole  40 mg Oral QAC breakfast  . pravastatin  40 mg Oral QHS  . QUEtiapine  37.5 mg Oral Daily  . QUEtiapine  50 mg Oral QAC supper   Continuous Infusions: . dextrose 5 % with kcl     PRN Meds:.acetaminophen **OR** acetaminophen, albuterol, haloperidol lactate   PHYSICAL EXAM: Vital signs: Vitals:   08/12/20 0702 08/12/20 1315 08/12/20 2000 08/13/20 0457  BP: 104/61 128/79 134/70 128/66  Pulse: 72 66 82 65  Resp: 16 14 16 17   Temp: 97.6 F (36.4 C) 97.7 F (36.5 C) 99 F (37.2 C) 98.3 F (36.8 C)  TempSrc: Oral     SpO2: 100% 100% 99% 100%   There were no vitals filed for this visit. There is no height or weight on file to calculate BMI.   Gen Exam:   Awake, pleasantly confused, moves all 4 extremities to commands, [place].AT,PERRAL Supple Neck,No JVD, No cervical lymphadenopathy appriciated.  Symmetrical Chest wall movement, Good air movement bilaterally, CTAB RRR,No Gallops, Rubs or new Murmurs, No Parasternal Heave +ve B.Sounds, Abd Soft, No tenderness, No organomegaly appriciated, No rebound - guarding or rigidity. No Cyanosis, Clubbing or edema, No new Rash or bruise   I have personally reviewed following labs and imaging studies  LABORATORY DATA: CBC: Recent Labs  Lab 08/07/20 0608  WBC 6.8  HGB 12.3*  HCT 39.4  MCV 81.9  PLT 296    Basic Metabolic Panel: Recent Labs  Lab 08/08/20 0206 08/09/20 0128 08/10/20 0317 08/11/20 0201 08/12/20 0817 08/13/20 0135  NA 152* 147* 147* 145 149* 146*  K 3.6 3.2* 3.1* 3.1* 3.9 3.4*  CL 119* 113* 114* 114* 116* 114*  CO2 28 29 26 26  32 29  GLUCOSE 118* 151* 133* 157* 112* 123*  BUN 10 10 9 9 12 11   CREATININE 0.98 1.03 0.91  0.80 1.02 0.95  CALCIUM 10.1 9.7 9.3 9.2 9.5 9.6  MG 2.1  --  1.9  --  2.1 2.1    GFR: CrCl cannot be calculated (Unknown ideal weight.).  Liver Function Tests: Recent Labs  Lab 08/07/20 0608  AST 18  ALT 14  ALKPHOS 92  BILITOT 0.9  PROT 7.2  ALBUMIN 3.2*   No results for input(s): LIPASE, AMYLASE in the last 168 hours. No results for input(s): AMMONIA in the last 168 hours.  Coagulation Profile: No results for input(s): INR, PROTIME in the last 168 hours.  Cardiac Enzymes: No results for input(s): CKTOTAL, CKMB, CKMBINDEX, TROPONINI in the last 168 hours.  BNP (last 3 results) No results for input(s): PROBNP in the last 8760 hours.  Lipid Profile: No  results for input(s): CHOL, HDL, LDLCALC, TRIG, CHOLHDL, LDLDIRECT in the last 72 hours.  Thyroid Function Tests: No results for input(s): TSH, T4TOTAL, FREET4, T3FREE, THYROIDAB in the last 72 hours.  Anemia Panel: No results for input(s): VITAMINB12, FOLATE, FERRITIN, TIBC, IRON, RETICCTPCT in the last 72 hours.  Urine analysis:    Component Value Date/Time   COLORURINE YELLOW 08/05/2020 1034   APPEARANCEUR CLOUDY (A) 08/05/2020 1034   LABSPEC 1.029 08/05/2020 1034   PHURINE 6.0 08/05/2020 1034   GLUCOSEU NEGATIVE 08/05/2020 1034   HGBUR MODERATE (A) 08/05/2020 1034   BILIRUBINUR NEGATIVE 08/05/2020 1034   KETONESUR NEGATIVE 08/05/2020 1034   PROTEINUR 30 (A) 08/05/2020 1034   NITRITE NEGATIVE 08/05/2020 1034   LEUKOCYTESUR LARGE (A) 08/05/2020 1034    Sepsis Labs: Lactic Acid, Venous No results found for: LATICACIDVEN  MICROBIOLOGY: Recent Results (from the past 240 hour(s))  Urine culture     Status: Abnormal   Collection Time: 08/05/20 10:34 AM   Specimen: Urine, Random  Result Value Ref Range Status   Specimen Description URINE, RANDOM  Final   Special Requests   Final    NONE Performed at [facility], [address] [place], [place] [address]    Culture >=100,000 COLONIES/mL  ESCHERICHIA COLI (A)  Final   Report Status 08/07/2020 FINAL  Final   Organism ID, Bacteria ESCHERICHIA COLI (A)  Final      Susceptibility   Escherichia coli - MIC*    AMPICILLIN 8 SENSITIVE Sensitive     CEFAZOLIN <=4 SENSITIVE Sensitive     CEFEPIME <=0.12 SENSITIVE Sensitive     CEFTRIAXONE <=0.25 SENSITIVE Sensitive     CIPROFLOXACIN <=0.25 SENSITIVE Sensitive     GENTAMICIN <=1 SENSITIVE Sensitive     IMIPENEM <=0.25 SENSITIVE Sensitive     NITROFURANTOIN <=16 SENSITIVE Sensitive     TRIMETH/SULFA <=20 SENSITIVE Sensitive     AMPICILLIN/SULBACTAM <=2 SENSITIVE Sensitive     PIP/TAZO <=4 SENSITIVE Sensitive     * >=100,000 COLONIES/mL ESCHERICHIA COLI  SARS CORONAVIRUS 2 (TAT 6-24 HRS) Nasopharyngeal Nasopharyngeal Swab     Status: None   Collection Time: 08/05/20 12:04 PM   Specimen: Nasopharyngeal Swab  Result Value Ref Range Status   SARS Coronavirus 2 NEGATIVE NEGATIVE Final    Comment: (NOTE) SARS-CoV-2 target nucleic acids are NOT DETECTED.  The SARS-CoV-2 RNA is generally detectable in upper and lower respiratory specimens during the acute phase of infection. Negative results do not preclude SARS-CoV-2 infection, do not rule out co-infections with other pathogens, and should not be used as the sole basis for treatment or other patient management decisions. Negative results must be combined with clinical observations, patient history, and epidemiological information. The expected result is Negative.  Fact Sheet for Patients: HairSlick.no  Fact Sheet for Healthcare Providers: quierodirigir.com  This test is not yet approved or cleared by the [place] FDA and  has been authorized for detection and/or diagnosis of SARS-CoV-2 by FDA under an Emergency Use Authorization (EUA). This EUA will remain  in effect (meaning this test can be used) for the duration of the COVID-19 declaration under Se ction 564(b)(1)  of the Act, 21 U.S.C. section 360bbb-3(b)(1), unless the authorization is terminated or revoked sooner.  Performed at [facility], [address], [place], [place] [address]   SARS CORONAVIRUS 2 (TAT 6-24 HRS) Nasopharyngeal Nasopharyngeal Swab     Status: None   Collection Time: 08/12/20  5:33 PM   Specimen: Nasopharyngeal Swab  Result Value Ref Range Status   SARS Coronavirus 2 NEGATIVE NEGATIVE Final    Comment: (NOTE) SARS-CoV-2 target nucleic acids are NOT DETECTED.  The SARS-CoV-2 RNA is generally detectable in upper and lower respiratory specimens during the acute phase of infection. Negative results do not preclude SARS-CoV-2 infection, do not rule out co-infections with other pathogens, and should not be used as the sole basis for treatment or other patient management decisions. Negative results must be combined with clinical observations, patient history, and epidemiological information. The expected result is Negative.  Fact Sheet for Patients: HairSlick.no  Fact Sheet for Healthcare Providers: quierodirigir.com  This test is not yet approved or cleared by the [place] FDA and  has been authorized for detection and/or diagnosis of SARS-CoV-2 by FDA under an Emergency Use Authorization (EUA). This EUA will remain  in effect (meaning this test can be used) for the duration of the COVID-19 declaration under Se ction 564(b)(1) of the Act, 21 U.S.C. section 360bbb-3(b)(1), unless the authorization is terminated or revoked sooner.  Performed at [facility], [address] [place], [place] [address]     RADIOLOGY STUDIES/RESULTS: DG Chest Port 1 View  Result Date: 08/13/2020 CLINICAL DATA:  Shortness of breath EXAM: PORTABLE CHEST 1 VIEW COMPARISON:  None. FINDINGS: Lungs are clear. Heart is upper normal in size with pulmonary vascularity normal. No adenopathy. There is aortic atherosclerosis.  No bone lesions. IMPRESSION: Lungs clear. Heart upper normal in size. Aortic Atherosclerosis (ICD10-I70.0). Electronically Signed   By: Bretta Bang III M.D.   On: 08/13/2020 08:15     LOS: 8 days   Susa Raring, MD  [facility]  08/13/2020, 8:33 AM

## 2020-08-13 NOTE — Progress Notes (Signed)
Occupational Therapy Treatment Patient Details Name: Blake Lutz MRN: [id number] DOB: 05-Sep-1945 Today's Date: 08/13/2020    History of present illness Pt is a 75 y.o. male admitted from [facility] on 08/05/20 with vomiting. Abdominal CT revealed gastric outlet obstruction requiring NGT. S/p EGD 4/5 showing esophagitis. Course complicated by dementia with delirium. PMH includes dementia, HTN.   OT comments  Pt found exiting chair,standing up in room with IV pulling. Redirected pt by taking pt for walk around unit with pt needing MIN A+1 for functional mobility greater than a household distance. Pt minimally verbal but does say "yes" "no" throughout session. Pt responds best to tactile cues for mobility and short simple commands for safety awareness. Pt would continue to benefit from skilled occupational therapy while admitted and after d/c to address the below listed limitations in order to improve overall functional mobility and facilitate independence with BADL participation. DC plan remains appropriate, will follow acutely per POC.     Follow Up Recommendations  SNF;Other (comment) (return to [facility], nursing facility)    Equipment Recommendations  None recommended by OT    Recommendations for Other Services      Precautions / Restrictions Precautions Precautions: Fall;Other (comment) Precaution Comments: Bladder/ bowel incontinence Restrictions Weight Bearing Restrictions: No       Mobility Bed Mobility               General bed mobility comments: pt OOB in recliner and returned to recliner at end of session    Transfers Overall transfer level: Needs assistance Equipment used: None Transfers: Sit to/from Stand Sit to Stand: Min assist         General transfer comment: pt standing up in room upon arrival, MIN A to have pt return to sitting in chair needing tactile cues at hips to safely descend into recliner    Balance Overall balance assessment:  Needs assistance         Standing balance support: No upper extremity supported;During functional activity Standing balance-Leahy Scale: Poor Standing balance comment: requires external assist for balance during mobility                           ADL either performed or assessed with clinical judgement   ADL Overall ADL's : Needs assistance/impaired                         Toilet Transfer: Minimal assistance;Ambulation Toilet Transfer Details (indicate cue type and reason): simulated via functional mobility with no AD and MIN A for balance and safety         Functional mobility during ADLs: Minimal assistance;Cueing for safety;Cueing for sequencing General ADL Comments: pt noted to be exiting chair as COTA passing by, assisted pt with safety with in room functional mobility as pt with condom catheter off and pulling IV out. pt mostly nonverbal but does respond best to tactile cues and short, commands     Vision       Perception     Praxis      Cognition Arousal/Alertness: Awake/alert Behavior During Therapy: Flat affect Overall Cognitive Status: History of cognitive impairments - at baseline                                 General Comments: baseline with dementia, pt inconsistently follows one step commands but benefits from tactile cues and  short, simple commands.        Exercises     Shoulder Instructions       General Comments      Pertinent Vitals/ Pain       Pain Assessment: Faces Faces Pain Scale: No hurt  Home Living                                          Prior Functioning/Environment              Frequency  Min 2X/week        Progress Toward Goals  OT Goals(current goals can now be found in the care plan section)  Progress towards OT goals: Progressing toward goals  Acute Rehab OT Goals OT Goal Formulation: Patient unable to participate in goal setting Time For Goal  Achievement: 08/21/20 Potential to Achieve Goals: Good  Plan Discharge plan remains appropriate;Frequency remains appropriate    Co-evaluation                 AM-PAC OT "6 Clicks" Daily Activity     Outcome Measure   Help from another person eating meals?: A Little (set- up) Help from another person taking care of personal grooming?: A Little Help from another person toileting, which includes using toliet, bedpan, or urinal?: A Lot Help from another person bathing (including washing, rinsing, drying)?: A Lot Help from another person to put on and taking off regular upper body clothing?: A Lot Help from another person to put on and taking off regular lower body clothing?: Total 6 Click Score: 13    End of Session Equipment Utilized During Treatment: Other (comment) (unable to don gait belt in time as pt impulsively moving throughout room)  OT Visit Diagnosis: Unsteadiness on feet (R26.81);Other abnormalities of gait and mobility (R26.89);Muscle weakness (generalized) (M62.81)   Activity Tolerance     Patient Left in chair;with call bell/phone within reach;with chair alarm set   Nurse Communication Mobility status;Other (comment) (may need safety sitter)        Time: [phone] OT Time Calculation (min): 18 min  Charges: OT General Charges $OT Visit: 1 Visit OT Treatments $Therapeutic Activity: 8-22 mins  Lenor Derrick., COTA/L Acute Rehabilitation Services [phone]    Blake Lutz 08/13/2020, 11:19 AM

## 2020-08-13 NOTE — Evaluation (Addendum)
Clinical/Bedside Swallow Evaluation Patient Details  Name: Blake Lutz MRN: [id number] Date of Birth: 1945-12-07  Today's Date: 08/13/2020 Time: SLP Start Time (ACUTE ONLY): 0945 SLP Stop Time (ACUTE ONLY): 1018 SLP Time Calculation (min) (ACUTE ONLY): 33 min  Past Medical History:  Past Medical History:  Diagnosis Date  . Alzheimer disease (HCC)   . GERD (gastroesophageal reflux disease)   . Hyperlipidemia   . Hypertension   . Tinnitus    Past Surgical History:  Past Surgical History:  Procedure Laterality Date  . ESOPHAGOGASTRODUODENOSCOPY (EGD) WITH PROPOFOL N/A 08/07/2020   Procedure: ESOPHAGOGASTRODUODENOSCOPY (EGD) WITH PROPOFOL;  Surgeon: Sherrilyn Rist, MD;  Location: [facility] ENDOSCOPY;  Service: Gastroenterology;  Laterality: N/A;   HPI:  Pt is a 75 y.o. male, with PMH of Alzheimer's, GERD, hypertension, dyslipidemia, compression fracture, prostate cancer who presented to the ED on 08/05/2020 with diarrhea. Abdominal CT revealed gastric outlet obstruction requiring NGT. Pt s/p EGD 4/5 which showed esophagitis and acute gastritis; a soft diet was recommended by GI. BSE completed 08/08/2020 with no clinical indications of oropharyngeal dysphagia at bedside. Soft solid diet with thin liquids recommended at that time with further advancement to be initiated per GI's recommendation. Given hx of dementia it was also recommended that pt recieve assistance from staff for all meals with no SLP f/u indicated. Repeat BSE requested 08/13/2020 with MD note 08/12/2020 stating: "patient pocketing foods and Meds, NPO except Meds, IVF, SLP re eval in am". Nursing affirmed the same.   Assessment / Plan / Recommendation Clinical Impression  Pt upright in chair upon SLP arrival, demonstrating adequate alertness for PO intake. Oral mechanism examination remains unremarkable, but as noted in previous BSE (08/08/09), pt baseline dementia largely impacts his ability to follow commands for thorough assessment of  oral cavity. Large consecutive sips of thin liquids via straw resulted in x1 throat clear across 3 trials. Very minimal- no residue in L lateral sulcus noted with puree and regular textured solids, which was cleared with liquid wash and independent lingual sweep from pt as needed. x1 throat clear after the swallow noted with regular textured solid across 2 trials. Pt risk for aspiration is minimal, however given cognitive status and impulsivity during intake, requires full 1:1 supervision for all meals to cue for/assist with the following strategies during self feeding: slow rate of intake, small bites/sips, and alternation of solids with liquids/purees. Recommend dys 3, thin liquid diet with SLP to f/u for tolerance. Educated Charity fundraiser regarding results of evaluation and swallow strategies to improve pt safety with intake, including inspection of oral cavity after meals/med administration to ensure complete oral clearance. RN verbalized agreement with above recommendations and treatment plan.  SLP Visit Diagnosis: Dysphagia, unspecified (R13.10)    Aspiration Risk  Mild aspiration risk    Diet Recommendation Thin liquid;Dysphagia 3 (Mech soft)   Liquid Administration via: Straw;Cup Medication Administration: Whole meds with puree Supervision: Full supervision/cueing for compensatory strategies;Staff to assist with self feeding Compensations: Minimize environmental distractions;Small sips/bites;Slow rate;Follow solids with liquid Postural Changes: Seated upright at 90 degrees    Other  Recommendations Oral Care Recommendations: Oral care BID   Follow up Recommendations Skilled Nursing facility      Frequency and Duration min 2x/week  1 week       Prognosis Prognosis for Safe Diet Advancement: Good Barriers to Reach Goals: Cognitive deficits      Swallow Study   General Date of Onset: 08/12/20 HPI: Pt is a 75 y.o. male, with PMH of  Alzheimer's, GERD, hypertension, dyslipidemia, compression  fracture, prostate cancer who presented to the ED on 08/05/2020 with diarrhea. Abdominal CT revealed gastric outlet obstruction requiring NGT. Pt s/p EGD 4/5 which showed esophagitis and acute gastritis; a soft diet was recommended by GI. BSE completed 08/08/2020 with no clinical indications of oropharyngeal dysphagia at bedside. Soft solid diet with thin liquids recommended at that time with further advancement to be initiated per GI's recommendation. Given hx of dementia it was also recommended that pt recieve assistance from staff for all meals with no SLP f/u indicated. Repeat BSE requested 08/13/2020 with MD note 08/12/2020 stating: "patient pocketing foods and Meds, NPO except Meds, IVF, SLP re eval in am". Nursing affirmed the same. Type of Study: Bedside Swallow Evaluation Previous Swallow Assessment: See HPI Diet Prior to this Study: NPO Temperature Spikes Noted: No Respiratory Status: Room air History of Recent Intubation: No Behavior/Cognition: Alert;Cooperative;Pleasant mood;Requires cueing Oral Cavity Assessment: Within Functional Limits Oral Care Completed by SLP: No Oral Cavity - Dentition: Adequate natural dentition Vision: Functional for self-feeding Self-Feeding Abilities: Able to feed self;Needs assist Patient Positioning: Upright in chair Baseline Vocal Quality: Normal Volitional Cough: Other (Comment) (Did not assess due to limited ability to follow commands given hx of dementia) Volitional Swallow:  (Did not assess due to limited ability to follow commands given hx of dementia)    Oral/Motor/Sensory Function Overall Oral Motor/Sensory Function: Within functional limits   Ice Chips Ice chips: Not tested   Thin Liquid Thin Liquid: Within functional limits (x1 throat clears with thin throughout entire eval) Presentation: Straw    Nectar Thick Nectar Thick Liquid: Not tested   Honey Thick Honey Thick Liquid: Not tested   Puree Puree: Within functional limits Presentation: Self  Fed;Spoon   Solid     Solid: Within functional limits Presentation: Spoon;Self Fed Other Comments:  (x1 throat clear with solid throughout entire eval, minimal-no residue in L lateral sulcus)     Avie Echevaria, MA, CCC-SLP Acute Rehabilitation Services Office Number: [phone]  Paulette Blanch 08/13/2020,11:53 AM

## 2020-08-13 NOTE — Progress Notes (Signed)
Staff assisted chair alarm belt placed with mgmt reccomendations. Pt instructed on how to remove.

## 2020-08-13 NOTE — Progress Notes (Signed)
MD also states that if pt refuses iv to document. Pt purposefully removed iv site several times between both shift as noted by this nurse and confirmed by night shift nurse who consecutively cared for pt.

## 2020-08-13 NOTE — Progress Notes (Signed)
Request for order from MD if ok to leave pt iv out d/t multiple removal by patient (more than 5 times in a period of a day). MD stated preferred pt to have an iv.Night shift made aware.

## 2020-08-14 DIAGNOSIS — K311 Adult hypertrophic pyloric stenosis: Secondary | ICD-10-CM | POA: Diagnosis not present

## 2020-08-14 LAB — BASIC METABOLIC PANEL
Anion gap: 6 (ref 5–15)
BUN: 10 mg/dL (ref 8–23)
CO2: 31 mmol/L (ref 22–32)
Calcium: 9.6 mg/dL (ref 8.9–10.3)
Chloride: 111 mmol/L (ref 98–111)
Creatinine, Ser: 0.9 mg/dL (ref 0.61–1.24)
GFR, Estimated: >60 mL/min (ref >60)
Glucose, Bld: 113 mg/dL — ABNORMAL HIGH (ref 70–99)
Potassium: 3.7 mmol/L (ref 3.5–5.1)
Sodium: 148 mmol/L — ABNORMAL HIGH (ref 135–145)

## 2020-08-14 MED ORDER — POTASSIUM CHLORIDE 2 MEQ/ML IV SOLN
INTRAVENOUS | Status: AC
  Filled 2020-08-14: qty 1000

## 2020-08-14 NOTE — Progress Notes (Signed)
Unable to administer IV fluids as ordered by the provider, patient refuses to have PIV inserted. IV team attempted to place an IV and pt refused.

## 2020-08-14 NOTE — TOC Initial Note (Signed)
Transition of Care [facility]) - Initial/Assessment Note    Patient Details  Name: Blake Lutz MRN: [id number] Date of Birth: [birth year]  [facility] CM/SW Contact:    Kermit Balo, RN Phone Number: 08/14/2020, 11:16 AM  Clinical Narrative:                 Patient is from [facility]. Palliative care to see him today to determine next steps.  TOC following for potential return to facility vs hospice services.  Expected Discharge Plan: Assisted Living Barriers to Discharge: Continued Medical Work up   Patient Goals and CMS Choice   CMS Medicare.gov Compare Post Acute Care list provided to:: Patient Represenative (must comment) Choice offered to / list presented to : Spouse  Expected Discharge Plan and Services Expected Discharge Plan: Assisted Living In-house Referral: Clinical Social Work Discharge Planning Services: CM Consult   Living arrangements for the past 2 months: Assisted Living Facility                                      Prior Living Arrangements/Services Living arrangements for the past 2 months: Assisted Living Facility Lives with:: Facility Resident Patient language and need for interpreter reviewed:: Yes Do you feel safe going back to the place where you live?: Yes        Care giver support system in place?: Yes (comment)   Criminal Activity/Legal Involvement Pertinent to Current Situation/Hospitalization: No - Comment as needed  Activities of Daily Living      Permission Sought/Granted                  Emotional Assessment       Orientation: : Oriented to Self   Psych Involvement: No (comment)  Admission diagnosis:  Vomiting [R11.10] Gastric outlet obstruction [K31.1] Gastric outflow obstruction [K31.1] Acute cystitis with hematuria [N30.01] Patient Active Problem List   Diagnosis Date Noted  . 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 Alzheimer disease (HCC) 08/05/2020  . Diarrhea 08/05/2020  . GERD  (gastroesophageal reflux disease) 08/05/2020  . Essential hypertension 08/05/2020  . Hypernatremia 08/05/2020  . 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   PCP:  Default, Provider, MD Pharmacy:  No Pharmacies Listed    Social Determinants of Health (SDOH) Interventions    Readmission Risk Interventions No flowsheet data found.

## 2020-08-14 NOTE — Progress Notes (Signed)
Spoke with pt spouse on today, updates given

## 2020-08-14 NOTE — Progress Notes (Signed)
Consult placed for VAST to place PIV. When attempting to touch arm and move to assess patient yanked his arm away aggressively and said no. This nurse had Alissa RN come to talk to patient. He also refused her. This patient has had 9 PIV in 9 days. D/t him pulling PIV out multiple times would be another reason for no PIV placement. RN notified. VU. Tomasita Morrow, RN VAST

## 2020-08-14 NOTE — Progress Notes (Signed)
PROGRESS NOTE        PATIENT DETAILS Name: Blake Lutz Age: 75 y.o. Sex: male Date of Birth: Sep 30, 1945 Admit Date: 08/05/2020 Admitting Physician Laqueta Due, DO RXY:VOPFYTW, Provider, MD  Brief Narrative: Patient is a 74 y.o. male dementia, HTN, HLD-who presented with vomiting-further evaluation revealed gastric outlet obstruction.  Significant events: 4/3>> admit from SNF-vomiting-found to have gastric outlet obstruction.  Significant studies: 4/3>> CT abdomen/pelvis: Gastric outlet obstruction.  Antimicrobial therapy: Rocephin: 4/3 x 1 Rocephin: 4/6 x1 Keflex: 4/7>>  Microbiology data: 4/3>> urine culture: E. coli  Procedures : 4/5>> EGD: Grade B esophagitis-from vomiting/NG trauma, gastritis, pylorus patent-no neoplasia/inflammation/stricture seen  Consults: GI, Pall Care  DVT Prophylaxis : SQ Lovenox    Subjective: Hysterectomy and indicated no distress but confused, denies any headache chest or abdominal pain.  Unreliable historian.  Assessment/Plan:  Gastric outlet obstruction: Resolved-EGD without any tumor-pylorus was patent.  Suspicion that patient may have had limited inflammation from a viral illness-that may have showed up as gastric outlet obstruction on CT imaging.  Continue soft diet as tolerated.  Problem now with dysphagia and pocketing of food, very poor oral intake.  Dysphagia - pocketing food and meds underlying advanced dementia, for now NPO except Meds, IVF, SLP input, DW wife, [facility] consulted for future plan of care, if significant decline wife open to Hospice.  Hypernatremia: placed on IV D5W which will be continued, will poor oral intake, likely will benefit from comfort measures, palliative care to see on 08/14/2020.  Esophagitis gastritis: Seen on EGD-thought to be due to either vomiting or NG trauma-continue PPI  Hypokalemia: replaced IV  HTN: BP stable-continue beta-blocker-hold HCTZ.  HLD:  Continue statin  E. coli UTI: Hard to tell whether patient is symptomatic given dementia-on Rocephin-has been transitioned to Keflex, finishing his course on 08/12/2020 evening.  Dementia with delirium (metabolic encephalopathy): We will continue to have delirium during this hospital stay-he is outside of his familiar surroundings-but mental status has gradually improved.  No longer with restraints-we will attempt to discontinue safety sitter today.  Remains on Seroquel and Namenda.    Elevated total protein level on admission: Likely a lab error-protein level this morning within normal limits this morning   Diet: Diet Order            DIET DYS 3 Room service appropriate? No; Fluid consistency: Thin  Diet effective now                  Code Status:  DNR  Family Communication: Spouse-Wilma-[phone]-left a voicemail on 4/8, 4/9, 08/13/20  Disposition Plan: Status is: Inpatient  Remains inpatient appropriate because:Inpatient level of care appropriate due to severity of illness   Dispo: The patient is from: SNF              Anticipated d/c is to: SNF              Patient currently is not medically stable to d/c.   Difficult to place patient No   Barriers to Discharge: Resolving delirium-still with bedside sitter-replacing potassium.  Facility not able to take patient over the weekend-so possible discharge on 4/11.  Antimicrobial agents:  Anti-infectives (From admission, onward)   Start     Dose/Rate Route Frequency Ordered Stop   08/09/20 1145  cephALEXin (KEFLEX) capsule 500 mg  500 mg Oral Every 12 hours 08/09/20 1047 08/12/20 2043   08/08/20 1400  cefTRIAXone (ROCEPHIN) 1 g in sodium chloride 0.9 % 100 mL IVPB  Status:  Discontinued        1 g 200 mL/hr over 30 Minutes Intravenous Every 24 hours 08/08/20 1216 08/09/20 1047   08/05/20 1145  cefTRIAXone (ROCEPHIN) 1 g in sodium chloride 0.9 % 100 mL IVPB        1 g 200 mL/hr over 30 Minutes Intravenous  Once  08/05/20 1138 08/05/20 1247       Time spent: 25- minutes-Greater than 50% of this time was spent in counseling, explanation of diagnosis, planning of further management, and coordination of care.  MEDICATIONS:  Scheduled Meds: . enoxaparin (LOVENOX) injection  40 mg Subcutaneous Q24H  . memantine  7.5 mg Oral BID  . metoprolol tartrate  25 mg Oral BID  . montelukast  10 mg Oral QHS  . pantoprazole  40 mg Oral QAC breakfast  . pravastatin  40 mg Oral QHS  . QUEtiapine  37.5 mg Oral Daily  . QUEtiapine  50 mg Oral QAC supper   Continuous Infusions: . dextrose 5 % with kcl     PRN Meds:.acetaminophen **OR** acetaminophen, albuterol, haloperidol lactate   PHYSICAL EXAM: Vital signs: Vitals:   08/13/20 1056 08/13/20 2149 08/13/20 2149 08/14/20 0558  BP: 133/79 (!) 120/94 (!) 120/94 (!) 142/86  Pulse: 87  (!) 102 78  Resp:   20 18  Temp:   98.2 F (36.8 C) 98.1 F (36.7 C)  TempSrc:      SpO2:   100%    There were no vitals filed for this visit. There is no height or weight on file to calculate BMI.   Gen Exam:   Awake, pleasantly confused, moves all 4 extremities to commands, .AT,PERRAL Supple Neck,No JVD, No cervical lymphadenopathy appriciated.  Symmetrical Chest wall movement, Good air movement bilaterally, CTAB RRR,No Gallops, Rubs or new Murmurs, No Parasternal Heave +ve B.Sounds, Abd Soft, No tenderness, No organomegaly appriciated, No rebound - guarding or rigidity. No Cyanosis, Clubbing or edema, No new Rash or bruise   I have personally reviewed following labs and imaging studies  LABORATORY DATA: CBC: No results for input(s): WBC, NEUTROABS, HGB, HCT, MCV, PLT in the last 168 hours.  Basic Metabolic Panel: Recent Labs  Lab 08/08/20 0206 08/09/20 0128 08/10/20 0317 08/11/20 0201 08/12/20 0817 08/13/20 0135 08/14/20 0718  NA 152*   < > 147* 145 149* 146* 148*  K 3.6   < > 3.1* 3.1* 3.9 3.4* 3.7  CL 119*   < > 114* 114* 116* 114* 111  CO2  28   < > 26 26 32 29 31  GLUCOSE 118*   < > 133* 157* 112* 123* 113*  BUN 10   < > 9 9 12 11 10   CREATININE 0.98   < > 0.91 0.80 1.02 0.95 0.90  CALCIUM 10.1   < > 9.3 9.2 9.5 9.6 9.6  MG 2.1  --  1.9  --  2.1 2.1  --    < > = values in this interval not displayed.    GFR: CrCl cannot be calculated (Unknown ideal weight.).  Liver Function Tests: No results for input(s): AST, ALT, ALKPHOS, BILITOT, PROT, ALBUMIN in the last 168 hours. No results for input(s): LIPASE, AMYLASE in the last 168 hours. No results for input(s): AMMONIA in the last 168 hours.  Coagulation Profile: No results for input(s):  INR, PROTIME in the last 168 hours.  Cardiac Enzymes: No results for input(s): CKTOTAL, CKMB, CKMBINDEX, TROPONINI in the last 168 hours.  BNP (last 3 results) No results for input(s): PROBNP in the last 8760 hours.  Lipid Profile: No results for input(s): CHOL, HDL, LDLCALC, TRIG, CHOLHDL, LDLDIRECT in the last 72 hours.  Thyroid Function Tests: No results for input(s): TSH, T4TOTAL, FREET4, T3FREE, THYROIDAB in the last 72 hours.  Anemia Panel: No results for input(s): VITAMINB12, FOLATE, FERRITIN, TIBC, IRON, RETICCTPCT in the last 72 hours.  Urine analysis:    Component Value Date/Time   COLORURINE YELLOW 08/05/2020 1034   APPEARANCEUR CLOUDY (A) 08/05/2020 1034   LABSPEC 1.029 08/05/2020 1034   PHURINE 6.0 08/05/2020 1034   GLUCOSEU NEGATIVE 08/05/2020 1034   HGBUR MODERATE (A) 08/05/2020 1034   BILIRUBINUR NEGATIVE 08/05/2020 1034   KETONESUR NEGATIVE 08/05/2020 1034   PROTEINUR 30 (A) 08/05/2020 1034   NITRITE NEGATIVE 08/05/2020 1034   LEUKOCYTESUR LARGE (A) 08/05/2020 1034    Sepsis Labs: Lactic Acid, Venous No results found for: LATICACIDVEN  MICROBIOLOGY: Recent Results (from the past 240 hour(s))  Urine culture     Status: Abnormal   Collection Time: 08/05/20 10:34 AM   Specimen: Urine, Random  Result Value Ref Range Status   Specimen Description  URINE, RANDOM  Final   Special Requests   Final    NONE Performed at [facility], [address] [place], [place] [address]    Culture >=100,000 COLONIES/mL ESCHERICHIA COLI (A)  Final   Report Status 08/07/2020 FINAL  Final   Organism ID, Bacteria ESCHERICHIA COLI (A)  Final      Susceptibility   Escherichia coli - MIC*    AMPICILLIN 8 SENSITIVE Sensitive     CEFAZOLIN <=4 SENSITIVE Sensitive     CEFEPIME <=0.12 SENSITIVE Sensitive     CEFTRIAXONE <=0.25 SENSITIVE Sensitive     CIPROFLOXACIN <=0.25 SENSITIVE Sensitive     GENTAMICIN <=1 SENSITIVE Sensitive     IMIPENEM <=0.25 SENSITIVE Sensitive     NITROFURANTOIN <=16 SENSITIVE Sensitive     TRIMETH/SULFA <=20 SENSITIVE Sensitive     AMPICILLIN/SULBACTAM <=2 SENSITIVE Sensitive     PIP/TAZO <=4 SENSITIVE Sensitive     * >=100,000 COLONIES/mL ESCHERICHIA COLI  SARS CORONAVIRUS 2 (TAT 6-24 HRS) Nasopharyngeal Nasopharyngeal Swab     Status: None   Collection Time: 08/05/20 12:04 PM   Specimen: Nasopharyngeal Swab  Result Value Ref Range Status   SARS Coronavirus 2 NEGATIVE NEGATIVE Final    Comment: (NOTE) SARS-CoV-2 target nucleic acids are NOT DETECTED.  The SARS-CoV-2 RNA is generally detectable in upper and lower respiratory specimens during the acute phase of infection. Negative results do not preclude SARS-CoV-2 infection, do not rule out co-infections with other pathogens, and should not be used as the sole basis for treatment or other patient management decisions. Negative results must be combined with clinical observations, patient history, and epidemiological information. The expected result is Negative.  Fact Sheet for Patients: HairSlick.nohttps://www.fda.gov/media/138098/download  Fact Sheet for Healthcare Providers: quierodirigir.comhttps://www.fda.gov/media/138095/download  This test is not yet approved or cleared by the [place] FDA and  has been authorized for detection and/or diagnosis of SARS-CoV-2 by FDA under an  Emergency Use Authorization (EUA). This EUA will remain  in effect (meaning this test can be used) for the duration of the COVID-19 declaration under Se ction 564(b)(1) of the Act, 21 U.S.C. section 360bbb-3(b)(1), unless the authorization is terminated or revoked sooner.  Performed  at [facility], [address], [place], [place] [address]   SARS CORONAVIRUS 2 (TAT 6-24 HRS) Nasopharyngeal Nasopharyngeal Swab     Status: None   Collection Time: 08/12/20  5:33 PM   Specimen: Nasopharyngeal Swab  Result Value Ref Range Status   SARS Coronavirus 2 NEGATIVE NEGATIVE Final    Comment: (NOTE) SARS-CoV-2 target nucleic acids are NOT DETECTED.  The SARS-CoV-2 RNA is generally detectable in upper and lower respiratory specimens during the acute phase of infection. Negative results do not preclude SARS-CoV-2 infection, do not rule out co-infections with other pathogens, and should not be used as the sole basis for treatment or other patient management decisions. Negative results must be combined with clinical observations, patient history, and epidemiological information. The expected result is Negative.  Fact Sheet for Patients: HairSlick.no  Fact Sheet for Healthcare Providers: quierodirigir.com  This test is not yet approved or cleared by the [place] FDA and  has been authorized for detection and/or diagnosis of SARS-CoV-2 by FDA under an Emergency Use Authorization (EUA). This EUA will remain  in effect (meaning this test can be used) for the duration of the COVID-19 declaration under Se ction 564(b)(1) of the Act, 21 U.S.C. section 360bbb-3(b)(1), unless the authorization is terminated or revoked sooner.  Performed at [facility], [address] [place], [place] [address]     RADIOLOGY STUDIES/RESULTS: DG Chest Port 1 View  Result Date: 08/13/2020 CLINICAL DATA:  Shortness of breath EXAM: PORTABLE  CHEST 1 VIEW COMPARISON:  None. FINDINGS: Lungs are clear. Heart is upper normal in size with pulmonary vascularity normal. No adenopathy. There is aortic atherosclerosis. No bone lesions. IMPRESSION: Lungs clear. Heart upper normal in size. Aortic Atherosclerosis (ICD10-I70.0). Electronically Signed   By: Bretta Bang III M.D.   On: 08/13/2020 08:15     LOS: 9 days   Susa Raring, MD  [facility]  08/14/2020, 11:13 AM

## 2020-08-14 NOTE — Progress Notes (Signed)
PT Cancellation Note  Patient Details Name: Blake Lutz MRN: [id number] DOB: [birth year]   Cancelled Treatment:    Reason Eval/Treat Not Completed: Fatigue/lethargy limiting ability to participate  Attempted to see at 9:10 and pt with SLP and very lethargic. Pt currently again lethargic and unable to participate. (Nurse tech reports pt was OOB to chair this morning and now back to bed).   Will re-attempt 4/13.   Jerolyn Center, PT Pager [phone]   Blake Lutz 08/14/2020, 1:41 PM

## 2020-08-14 NOTE — Progress Notes (Signed)
  Speech Language Pathology Treatment: Dysphagia  Patient Details Name: Blake Lutz MRN: [id number] DOB: [birth year] Today's Date: 08/14/2020 Time: 6948-5462 SLP Time Calculation (min) (ACUTE ONLY): 35 min  Assessment / Plan / Recommendation Clinical Impression  Pt seen for dysphagia treatment this am to assess tolerance of dysphagia 3, thin liquid diet during am mealtime. Pt very lethargic, lying in bed upon SLP arrival. Repositioned pt upright and provided max verbal and tactile stimulation cues, which was not effective in increasing alertness. In an attempt to increase attention to POs, spoon coated lightly with pureed solid administerd to pt, which he instantly reacted to, but with no oral manipulation or attempt to swallow noted. Hand over hand assist to bring cup/straw to lips encouraged x1 sip of thin liquids, which pt subsequently held/swished in oral cavity for > 1 min.  Max verbal/tactile cues provided for pt to swallow, which occurred x2, but only after coughing x1 and suspect possible premature spillage of liquids due to reduced attention. Ultimately, all POs required manual removal with finger sweep and oral suction. Discussed above to NT and RN and educated regarding ensuring pt is adequately alert for all PO intake and provide 1:1 supervision during all meals (checking for oral clearance of solids/meds) due to inconsistency with alertness from day to day. SLP to f/u to assess diet tolerance when pt more adequately alert.   HPI HPI: Pt is a 75 y.o. male, with PMH of Alzheimer's, GERD, hypertension, dyslipidemia, compression fracture, prostate cancer who presented to the ED on 08/05/2020 with diarrhea. Abdominal CT revealed gastric outlet obstruction requiring NGT. Pt s/p EGD 4/5 which showed esophagitis and acute gastritis; a soft diet was recommended by GI. BSE completed 08/08/2020 with no clinical indications of oropharyngeal dysphagia at bedside. Soft solid diet with thin liquids  recommended at that time with further advancement to be initiated per GI's recommendation. Given hx of dementia it was also recommended that pt recieve assistance from staff for all meals with no SLP f/u indicated. Repeat BSE requested 08/13/2020 with MD note 08/12/2020 stating: "patient pocketing foods and Meds, NPO except Meds, IVF, SLP re eval in am". Nursing affirmed the same.      SLP Plan  Continue with current plan of care       Recommendations  Diet recommendations: Dysphagia 3 (mechanical soft);Thin liquid Liquids provided via: Straw;Cup Medication Administration: Whole meds with puree Supervision: Full supervision/cueing for compensatory strategies;Staff to assist with self feeding Compensations: Minimize environmental distractions;Small sips/bites;Slow rate;Follow solids with liquid Postural Changes and/or Swallow Maneuvers: Seated upright 90 degrees                Oral Care Recommendations: Oral care BID Follow up Recommendations: Skilled Nursing facility SLP Visit Diagnosis: Dysphagia, unspecified (R13.10) Plan: Continue with current plan of care       GO               Avie Echevaria, MA, CCC-SLP Acute Rehabilitation Services Office Number: [phone]  Paulette Blanch 08/14/2020, 9:58 AM

## 2020-08-15 DIAGNOSIS — Z515 Encounter for palliative care: Secondary | ICD-10-CM

## 2020-08-15 DIAGNOSIS — K311 Adult hypertrophic pyloric stenosis: Secondary | ICD-10-CM | POA: Diagnosis not present

## 2020-08-15 LAB — RESP PANEL BY RT-PCR (FLU A&B, COVID) ARPGX2
Influenza A by PCR: NEGATIVE
Influenza B by PCR: NEGATIVE
SARS Coronavirus 2 by RT PCR: NEGATIVE

## 2020-08-15 MED ORDER — PANTOPRAZOLE SODIUM 40 MG PO TBEC: 40.0000 mg | DELAYED_RELEASE_TABLET | Freq: Every day | ORAL | Status: AC

## 2020-08-15 NOTE — Plan of Care (Signed)
  Problem: Acute Rehab OT Goals (only OT should resolve) Goal: Pt. Will Perform Eating Outcome: Completed/Met Goal: Pt. Will Perform Grooming Outcome: Completed/Met Goal: Pt. Will Transfer To Toilet Outcome: Completed/Met Goal: OT Additional ADL Goal #1 Outcome: Completed/Met   Problem: Acute Rehab PT Goals(only PT should resolve) Goal: Pt Will Go Supine/Side To Sit Outcome: Completed/Met Goal: Patient Will Transfer Sit To/From Stand Outcome: Completed/Met Goal: Pt Will Ambulate Outcome: Completed/Met  Discharge report given to receiving nurse at [facility].  Family aware of patient's discharge and time of discharge PTAR called.  Awaiting transport.

## 2020-08-15 NOTE — Care Management Important Message (Signed)
Important Message  Patient Details  Name: Blake Lutz MRN: [id number] Date of Birth: 30-Jan-1946   Medicare Important Message Given:  Yes     Alieyah Spader Stefan Church 08/15/2020, 2:10 PM

## 2020-08-15 NOTE — Progress Notes (Addendum)
Physical Therapy Treatment and Discharge Patient Details Name: Blake Lutz MRN: [id number] DOB: 1946/02/08 Today's Date: 08/15/2020    History of Present Illness Pt is a 75 y.o. male admitted from [facility] on 08/05/20 with vomiting. Abdominal CT revealed gastric outlet obstruction requiring NGT. S/p EGD 4/5 showing esophagitis. Course complicated by dementia with delirium. PMH includes dementia, HTN.    PT Comments    Patient awake and smiling on arrival. Does not follow verbal commands or even gestural cues. Does respond to light physical cues/facilitation. Able to get OOB and ambulate with 1 person hand held assist at min assist level. Min assist for initial slight imbalance posteriorly and then primarily to guide patient as walking (to continue walking). Unfortunately, pt's dementia is to a degree that continued skilled PT is not indicated as he will always need guidance/min assistance when up mobilizing. Recent goals met and patient is discharged from PT.     Follow Up Recommendations  Supervision/Assistance - 24 hour (return to ALF or long-term care)     Equipment Recommendations  None recommended by PT    Recommendations for Other Services       Precautions / Restrictions Precautions Precautions: Fall;Other (comment) Precaution Comments: Bladder/ bowel incontinence    Mobility  Bed Mobility Overal bed mobility: Needs Assistance Bed Mobility: Supine to Sit;Sit to Supine     Supine to sit: HOB elevated;Min assist Sit to supine: Min guard   General bed mobility comments: guiding cues for coming to sit (did not understand gestural cues, needed hands-on facilitation);    Transfers Overall transfer level: Needs assistance Equipment used: 1 person hand held assist Transfers: Sit to/from Stand Sit to Stand: Min assist         General transfer comment: guiding cues to come to stand with slight posterior bias on initial  standing  Ambulation/Gait Ambulation/Gait assistance: Min assist Gait Distance (Feet): 60 Feet Assistive device: 1 person hand held assist Gait Pattern/deviations: Shuffle;Step-through pattern;Decreased stride length Gait velocity: reduced   General Gait Details: pt with short steps but with good foot clearance bil. After 30 ft, pt initiated return to room (? because he had family visiting??) and could not easily redirect him to walk any farther   [employer]             Wheelchair Mobility    Modified Rankin (Stroke Patients Only)       Balance Overall balance assessment: Needs assistance Sitting-balance support: Feet supported;No upper extremity supported Sitting balance-Leahy Scale: Fair     Standing balance support: No upper extremity supported;During functional activity Standing balance-Leahy Scale: Poor Standing balance comment: requires external assist for balance during mobility                            Cognition Arousal/Alertness: Awake/alert Behavior During Therapy: WFL for tasks assessed/performed Overall Cognitive Status: History of cognitive impairments - at baseline                                 General Comments: baseline with dementia, pt not following one step commands with verbal cues but benefits from tactile cues      Exercises      General Comments General comments (skin integrity, edema, etc.): sister and BIL in room on arrival. Answered their questions re: his physical abilities.      Pertinent Vitals/Pain Pain Assessment: Faces Faces Pain Scale:  No hurt    Home Living                      Prior Function            PT Goals (current goals can now be found in the care plan section) Acute Rehab PT Goals Patient Stated Goal: Unstated PT Goal Formulation: Patient unable to participate in goal setting Time For Goal Achievement: 08/21/20 Progress towards PT goals:  (Goals met and discharged)     Frequency    Min 2X/week      PT Plan Discharge plan needs to be updated    Co-evaluation              AM-PAC PT "6 Clicks" Mobility   Outcome Measure  Help needed turning from your back to your side while in a flat bed without using bedrails?: A Little Help needed moving from lying on your back to sitting on the side of a flat bed without using bedrails?: A Little Help needed moving to and from a bed to a chair (including a wheelchair)?: A Little Help needed standing up from a chair using your arms (e.g., wheelchair or bedside chair)?: A Little Help needed to walk in hospital room?: A Little Help needed climbing 3-5 steps with a railing? : A Lot 6 Click Score: 17    End of Session Equipment Utilized During Treatment: Gait belt Activity Tolerance: Patient tolerated treatment well Patient left: with call bell/phone within reach;in bed;with bed alarm set;with family/visitor present (per RN, did not put pt  up in chair) Nurse Communication: Mobility status PT Visit Diagnosis: Other abnormalities of gait and mobility (R26.89);Muscle weakness (generalized) (M62.81)    PT Discharge Note  Patient is being discharged from PT services secondary to:  Marland Kitchen Goals met and no further therapy needs identified.  Please see latest Therapy Progress Note for current level of functioning and progress toward goals.  Progress and discharge plan and discussed with patient/caregiver and they   . Patient unable to participate in discharge planning   Time: 1154-1213 PT Time Calculation (min) (ACUTE ONLY): 19 min  Charges:  $Therapeutic Activity: 8-22 mins                      Arby Barrette, PT Pager [phone]    Rexanne Mano 08/15/2020, 12:26 PM

## 2020-08-15 NOTE — Progress Notes (Signed)
Nutrition Brief Note  Chart reviewed. Pt now transitioning to comfort care.  No further nutrition interventions planned at this time.  Please re-consult as needed.   Kaylenn Civil, MS, RD, LDN RD pager number and weekend/on-call pager number located in Amion.    

## 2020-08-15 NOTE — Progress Notes (Signed)
  Speech Language Pathology Treatment: Dysphagia  Patient Details Name: Blake Lutz MRN: [id number] DOB: 1946-02-23 Today's Date: 08/15/2020 Time: 0321-2248 SLP Time Calculation (min) (ACUTE ONLY): 21 min  Assessment / Plan / Recommendation Clinical Impression  SLP f/u for dysphagia tx with pt upright in bed and alert, which is a notable improvement from last session. Sister-in-law and her spouse present to provid information regarding pt baseline PO intake, stating he consumed regular diet. RN entered room and administered meds crushed in puree with pt exhibiting no overt difficulty or signs of oropharyngeal dysphagia. With set up and mod verbal cues to initiate self feeding, take small bites/sips, alternate solids with liquids/purees at slow rate, pt consumed puree and complex regular textured solid and thin liquid snack without overt s/sx of aspiration with complete oral clearance of solids noted. Recommend pt continue on dysphagia 3, thin liquid diet with staff to assist with self-feeding and provide 1:1 supervision to provide cues for above noted strategies to increase safety with PO intake. Above discussed with RN and family members and reinforced ensuring pt is alert for PO intake to further maximize safety. All parties expressed agreement and verbalized understanding. No further needs for SLP services and will s/o.   HPI HPI: Pt is a 75 y.o. male, with PMH of Alzheimer's, GERD, hypertension, dyslipidemia, compression fracture, prostate cancer who presented to the ED on 08/05/2020 with diarrhea. Abdominal CT revealed gastric outlet obstruction requiring NGT. Pt s/p EGD 4/5 which showed esophagitis and acute gastritis; a soft diet was recommended by GI. BSE completed 08/08/2020 with no clinical indications of oropharyngeal dysphagia at bedside. Soft solid diet with thin liquids recommended at that time with further advancement to be initiated per GI's recommendation. Given hx of dementia it was also  recommended that pt recieve assistance from staff for all meals with no SLP f/u indicated. Repeat BSE requested 08/13/2020 with MD note 08/12/2020 stating: "patient pocketing foods and Meds, NPO except Meds, IVF, SLP re eval in am". Nursing affirmed the same.      SLP Plan  Discharge SLP treatment due to (comment);All goals met (all goals met, pt tolerating dys 3 diet)       Recommendations  Diet recommendations: Dysphagia 3 (mechanical soft);Thin liquid Liquids provided via: Straw;Cup Medication Administration: Whole meds with puree Supervision: Staff to assist with self feeding;Full supervision/cueing for compensatory strategies Compensations: Minimize environmental distractions;Small sips/bites;Slow rate;Follow solids with liquid Postural Changes and/or Swallow Maneuvers: Seated upright 90 degrees                Oral Care Recommendations: Oral care BID Follow up Recommendations: Skilled Nursing facility SLP Visit Diagnosis: Dysphagia, unspecified (R13.10) Plan: Discharge SLP treatment due to (comment);All goals met (all goals met, pt tolerating dys 3 diet)       [place], [place], [place] Office Number: [phone]  Blake Lutz 08/15/2020, 11:34 AM

## 2020-08-15 NOTE — TOC Transition Note (Signed)
Transition of Care [facility]) - CM/SW Discharge Note   Patient Details  Name: Blake Lutz MRN: [id number] Date of Birth: 1946/04/01  [facility] CM/SW Contact:  Kermit Balo, RN Phone Number: 08/15/2020, 2:54 PM   Clinical Narrative:    Patient is returning to [facility] today with [facility] following for palliative/ hospice. CM has sent information to [facility] and they have given the ok to send him back. CM spoke to patients daughter and wife and they are in agreement. They had asked about rehab but pt is doing well enough per PT to return to ALF. Family to look at other ALF's in the near future for possible transfer.  Facility informed to check for pocketing with meals and meds. Voiced understanding. Pt transporting via [facility] to [facility]. Bedside RN updated and d/c packet provided to her.    Final next level of care: Assisted Living Barriers to Discharge: No Barriers Identified   Patient Goals and CMS Choice   CMS Medicare.gov Compare Post Acute Care list provided to:: Patient Represenative (must comment) Choice offered to / list presented to : Spouse  Discharge Placement                       Discharge Plan and Services In-house Referral: Clinical Social Work Discharge Planning Services: CM Consult                                 Social Determinants of Health (SDOH) Interventions     Readmission Risk Interventions No flowsheet data found.

## 2020-08-15 NOTE — NC FL2 (Signed)
[place] MEDICAID FL2 LEVEL OF CARE SCREENING TOOL     IDENTIFICATION  Patient Name: Blake Lutz Birthdate: March 15, 1946 Sex: male Admission Date (Current Location): 08/05/2020  [facility] and [place] Number:  Producer, television/film/video and Address:  The [place]. [facility], [address], [place], [place] [address]      Provider Number: [id number]  Attending Physician Name and Address:  Leroy Sea, MD  Relative Name and Phone Number:  Beaux Wedemeyer (spouse) [phone]    Current Level of Care: Hospital Recommended Level of Care: Skilled Nursing Facility Prior Approval Number:    Date Approved/Denied:   PASRR Number: [id number]  Discharge Plan: Other (Comment) (Assisted Living Facility)    Current Diagnoses: Patient Active Problem List   Diagnosis Date Noted  . 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 Alzheimer disease (HCC) 08/05/2020  . Diarrhea 08/05/2020  . GERD (gastroesophageal reflux disease) 08/05/2020  . Essential hypertension 08/05/2020  . Hypernatremia 08/05/2020  . 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    Orientation RESPIRATION BLADDER Height & Weight      (disoriented)  Normal External catheter Weight:   Height:     BEHAVIORAL SYMPTOMS/MOOD NEUROLOGICAL BOWEL NUTRITION STATUS     (n/a) Continent Diet (soft diet)  AMBULATORY STATUS COMMUNICATION OF NEEDS Skin   Limited Assist Verbally (delayed responses) Normal                       Personal Care Assistance Level of Assistance  Bathing,Feeding,Dressing,Total care Bathing Assistance: Limited assistance Feeding assistance: Limited assistance Dressing Assistance: Limited assistance Total Care Assistance: Limited assistance   Functional Limitations Info  Sight,Hearing,Speech Sight Info: Adequate Hearing Info: Adequate Speech Info: Impaired (delayed responses)    SPECIAL CARE FACTORS FREQUENCY  Speech therapy     PT Frequency: 5x/wk OT Frequency: 5x/wk     Speech Therapy  Frequency: 5x/wk      Contractures Contractures Info: Not present    Additional Factors Info  Code Status,Allergies,Psychotropic,Insulin Sliding Scale,Isolation Precautions,Suctioning Needs Code Status Info: DNR Allergies Info: Ibuprofen, shellfish Psychotropic Info: Namenda, Seroquel Insulin Sliding Scale Info: See discharge summary for sliding scale info Isolation Precautions Info: none Suctioning Needs: n/a   Current Medications (08/15/2020):  This is the current hospital active medication list Current Facility-Administered Medications  Medication Dose Route Frequency Provider Last Rate Last Admin  . acetaminophen (TYLENOL) tablet 650 mg  650 mg Oral Q6H PRN Charlie Pitter III, MD   650 mg at 08/10/20 2043   Or  . acetaminophen (TYLENOL) suppository 650 mg  650 mg Rectal Q6H PRN Charlie Pitter III, MD      . albuterol (PROVENTIL) (2.5 MG/3ML) 0.083% nebulizer solution 2.5 mg  2.5 mg Nebulization Q4H PRN Charlie Pitter III, MD      . enoxaparin (LOVENOX) injection 40 mg  40 mg Subcutaneous Q24H Maretta Bees, MD   40 mg at 08/14/20 1354  . haloperidol lactate (HALDOL) injection 2 mg  2 mg Intravenous Q6H PRN Maretta Bees, MD   2 mg at 08/12/20 1803  . memantine (NAMENDA) tablet 7.5 mg  7.5 mg Oral BID Maretta Bees, MD   7.5 mg at 08/15/20 1107  . metoprolol tartrate (LOPRESSOR) tablet 25 mg  25 mg Oral BID Maretta Bees, MD   25 mg at 08/15/20 1105  . montelukast (SINGULAIR) tablet 10 mg  10 mg Oral QHS Maretta Bees, MD   10 mg at 08/14/20 2141  . pantoprazole (PROTONIX)  EC tablet 40 mg  40 mg Oral QAC breakfast Maretta Bees, MD   40 mg at 08/15/20 1107  . pravastatin (PRAVACHOL) tablet 40 mg  40 mg Oral QHS Maretta Bees, MD   40 mg at 08/14/20 2136  . QUEtiapine (SEROQUEL) tablet 37.5 mg  37.5 mg Oral Daily Maretta Bees, MD   37.5 mg at 08/15/20 1106  . QUEtiapine (SEROQUEL) tablet 50 mg  50 mg Oral QAC supper Maretta Bees, MD   50  mg at 08/14/20 1734     Discharge Medications: Please see discharge summary for a list of discharge medications.  Relevant Imaging Results:  Relevant Lab Results:   Additional Information SS# [id number]  Kermit Balo, RN

## 2020-08-15 NOTE — NC FL2 (Addendum)
[place] MEDICAID FL2 LEVEL OF CARE SCREENING TOOL     IDENTIFICATION  Patient Name: Blake Lutz Birthdate: [birth year] Sex: male Admission Date (Current Location): 08/05/2020  [facility] and [place] Number:  Producer, television/film/video and Address:  [facility] [place], [address] [place], [place] [address]      Provider Number: [id number]  Attending Physician Name and Address:  Leroy Sea, MD  Relative Name and Phone Number:  Marshon Bangs (spouse) [phone]    Current Level of Care: Hospital Recommended Level of Care: Assisted Living Facility Prior Approval Number:    Date Approved/Denied:   PASRR Number: [id number]  Discharge Plan: Other (Comment) (Assisted Living Facility)    Current Diagnoses: Patient Active Problem List   Diagnosis Date Noted  . 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 Alzheimer disease (HCC) 08/05/2020  . Diarrhea 08/05/2020  . GERD (gastroesophageal reflux disease) 08/05/2020  . Essential hypertension 08/05/2020  . Hypernatremia 08/05/2020  . 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    Orientation RESPIRATION BLADDER Height & Weight      (disoriented)  Normal External catheter Weight:   Height:     BEHAVIORAL SYMPTOMS/MOOD NEUROLOGICAL BOWEL NUTRITION STATUS     (n/a) Continent Diet (soft diet)  AMBULATORY STATUS COMMUNICATION OF NEEDS Skin   Limited Assist Verbally (delayed responses) Normal                       Personal Care Assistance Level of Assistance  Bathing,Feeding,Dressing,Total care Bathing Assistance: Limited assistance Feeding assistance: Limited assistance Dressing Assistance: Limited assistance Total Care Assistance: Limited assistance   Functional Limitations Info  Sight,Hearing,Speech   Hearing Info: Adequate Speech Info: Impaired (delayed responses)    SPECIAL CARE FACTORS FREQUENCY  PT (By licensed PT),OT (By licensed OT)     PT Frequency: 3X OT Frequency: 3X             Contractures Contractures Info: Not present    Additional Factors Info  Code Status,Allergies,Psychotropic,Insulin Sliding Scale,Isolation Precautions,Suctioning Needs Code Status Info: DNR Allergies Info: Ibuprofen, shellfish Psychotropic Info: Namenda, Seroquel Insulin Sliding Scale Info: See discharge summary for sliding scale info Isolation Precautions Info: none Suctioning Needs: n/a   Current Medications (08/15/2020):  This is the current hospital active medication list Current Facility-Administered Medications  Medication Dose Route Frequency Provider Last Rate Last Admin  . acetaminophen (TYLENOL) tablet 650 mg  650 mg Oral Q6H PRN Charlie Pitter III, MD   650 mg at 08/10/20 2043   Or  . acetaminophen (TYLENOL) suppository 650 mg  650 mg Rectal Q6H PRN Charlie Pitter III, MD      . albuterol (PROVENTIL) (2.5 MG/3ML) 0.083% nebulizer solution 2.5 mg  2.5 mg Nebulization Q4H PRN Charlie Pitter III, MD      . dextrose 5 % 1,000 mL with potassium chloride 40 mEq infusion   Intravenous Continuous Leroy Sea, MD      . enoxaparin (LOVENOX) injection 40 mg  40 mg Subcutaneous Q24H Maretta Bees, MD   40 mg at 08/14/20 1354  . haloperidol lactate (HALDOL) injection 2 mg  2 mg Intravenous Q6H PRN Maretta Bees, MD   2 mg at 08/12/20 1803  . memantine (NAMENDA) tablet 7.5 mg  7.5 mg Oral BID Maretta Bees, MD   7.5 mg at 08/14/20 1354  . metoprolol tartrate (LOPRESSOR) tablet 25 mg  25 mg Oral BID Maretta Bees, MD   25 mg at 08/14/20 2140  .  montelukast (SINGULAIR) tablet 10 mg  10 mg Oral QHS Maretta Bees, MD   10 mg at 08/14/20 2141  . pantoprazole (PROTONIX) EC tablet 40 mg  40 mg Oral QAC breakfast Maretta Bees, MD   40 mg at 08/14/20 1058  . pravastatin (PRAVACHOL) tablet 40 mg  40 mg Oral QHS Maretta Bees, MD   40 mg at 08/14/20 2136  . QUEtiapine (SEROQUEL) tablet 37.5 mg  37.5 mg Oral Daily Maretta Bees, MD   37.5 mg at 08/14/20  1057  . QUEtiapine (SEROQUEL) tablet 50 mg  50 mg Oral QAC supper Maretta Bees, MD   50 mg at 08/14/20 1734     Discharge Medications: TAKE these medications   magnesium hydroxide 400 MG/5ML suspension Commonly known as: MILK OF MAGNESIA Take 30 mLs by mouth daily as needed for mild constipation.   memantine 5 MG tablet Commonly known as: NAMENDA Take 7.5 mg by mouth 2 (two) times daily. Take 1.5 tabs by mouth twice daily   metoprolol tartrate 25 MG tablet Commonly known as: LOPRESSOR Take 25 mg by mouth 2 (two) times daily.   montelukast 10 MG tablet Commonly known as: SINGULAIR Take 10 mg by mouth at bedtime.   pantoprazole 40 MG tablet Commonly known as: PROTONIX Take 1 tablet (40 mg total) by mouth daily before breakfast.   polyethylene glycol 17 g packet Commonly known as: MIRALAX / GLYCOLAX Take 17 g by mouth See admin instructions. Mix 17g in beverage of choice and take by mouth   potassium chloride 20 MEQ/15ML (10%) Soln Take 20 mEq by mouth daily.   QUEtiapine 25 MG tablet Commonly known as: SEROQUEL Take 37.5 mg by mouth See admin instructions. Take 1.5 tab (37.5 mg) by mouth every morning   QUEtiapine 25 MG tablet Commonly known as: SEROQUEL Take 50 mg by mouth See admin instructions. Take 2 tablets by mouth at 1700   senna-docusate 8.6-50 MG tablet Commonly known as: Senokot-S Take 2 tablets by mouth daily.      Relevant Imaging Results:  Relevant Lab Results:   Additional Information SS# [id number]  Kermit Balo, RN

## 2020-08-15 NOTE — Consult Note (Signed)
Consultation Note Date: 08/15/2020   Patient Name: Blake Lutz  DOB: 04/19/1946  MRN: [id number]  Age / Sex: 75 y.o., male  PCP: Default, Provider, MD Referring Physician: Thurnell Lose, MD  Reason for Consultation: Establishing goals of care  HPI/Patient Profile: 75 y.o. male  with past medical history of alzheimer's dementia (diagnosed in 2015) who was admitted on 08/05/2020 with diarrhea, vomiting and was found to have a gastric outlet obstruction.  He underwent EGD on 4/5 and the GOO had resolved.   He had a UTI and suffered with delirium.  He's also had periods of lethargy preventing him from working with therapies.  His PO intake has really waivered and he quickly develops hypernatremia.  PMT was asked to see the patient as the medical team fears the patient's poor PO intake will effect his electrolytes and quickly raise his sodium level once again.  The patient has had 9 IVs placed and pulled each one of them out.  He apparently does not want things in his body.  Clinical Assessment and Goals of Care:  I have reviewed medical records including EPIC notes, labs and imaging, received report from the care team, examined the patient and spoke on the phone with his wife Blake Lutz and daughter Blake Lutz  to discuss diagnosis prognosis, [place], EOL wishes, disposition and options.  Blake Lutz was awake and alert in bed when I met him.  He seemed very kind.  He attempted to speak with me but 90%+ was gibberish.  Every once in a while a clear phrase would come out.  While Blake Lutz remained very pleasant thru out my visit I could tell that he had questions for me and he was frustrated that he could not express himself the way he wanted to.   The RN tech brought him a cup of juice which he wanted to drink - but he kept pulling out the straw.  I was as though he did not understand that the straw needed to be in the cup for him  to drink.  He needed the RN tech's assistance to consume the juice.  I introduced Palliative Medicine as specialized medical care for people living with serious illness. It focuses on providing relief from the symptoms and stress of a serious illness. We discussed a brief life review of the patient.   Blake Lutz is quite a distinguished and accomplished gentleman.  He is described as a very giving caring man who always tries to lift up others around him.  He is a retired Company secretary, he was an Tourist information centre manager, he is a published Chief Strategy Officer, a marathon runner, and a Educational psychologist.  He is a down to earth man.  One of his books is the account of the [employer] in [place].  Blake Lutz helped them to be recognized and receive the Congressional Medal of Honor.  Another book "African American Voices from [place]" details the experiences of African Americans who fought in [place] during [place].  As  far as functional and nutritional status PTA he was able to stand and walk with assistance.  He has not been able to follow commands with PT in the hospital.  His speech is mostly unintelligible.  I fear that without artificial hydration he will decline quickly into hypernatremia and renal failure.  We discussed his current illness and what it means in the larger context of his on-going co-morbidities.  Natural disease trajectory and expectations at EOL were discussed.  I attempted to elicit values and goals of care important to the patient.   The difference between aggressive medical intervention and comfort care was considered in light of the patient's goals of care.  Advanced directives, concepts specific to code status, artifical feeding and hydration, and rehospitalization were considered and discussed.  Blake Lutz was already a DNR prior to my consultation.  We completed a MOST form and his decision makers (Wife Blake Lutz & daughter Blake Lutz)   I completed a MOST form today. The patient and  family outlined their wishes for the following treatment decisions:  Cardiopulmonary Resuscitation: Do Not Attempt Resuscitation (DNR/No CPR)  Medical Interventions: Comfort Measures: Keep clean, warm, and dry. Use medication by any route, positioning, wound care, and other measures to relieve pain and suffering. Use oxygen, suction and manual treatment of airway obstruction as needed for comfort. Do not transfer to the hospital unless comfort needs cannot be met in current location.  Antibiotics: Determine use of limitation of antibiotics when infection occurs  IV Fluids: IV fluids for a defined trial period  Feeding Tube: No feeding tube   Wife is concerned that he has had antibiotics multiple times recently and they are becoming in-effective.  Family feels he will continue to pull out IVs and feeding tubes.  Hospice and Palliative Care services outpatient were explained and offered.  Ideally family would like for patient to return to ALF with Hospice.  If he does not yet qualify for  Hospice then please DC with Palliative.   I spoke with [facility] and explained that he will likely dehydrate and qualify for Hospice very soon.  Questions and concerns were addressed.  The family was encouraged to call with questions or concerns.        Primary Decision Maker:  NEXT OF KIN Wife (Blake Lutz) and daughter Blake Lutz)    SUMMARY OF RECOMMENDATIONS    See MOST form choice above.  Comfort Measures, No feeding tube. Will likely dehydrate quickly.   I've asked Palliative to follow closely as I believe he will be ready for Hospice soon.  Code Status/Advance Care Planning:  DNR   Symptom Management:   Per primary.  Additional Recommendations (Limitations, Scope, Preferences):  Full Comfort Care  Palliative Prophylaxis:   Delirium Protocol  Psycho-social/Spiritual:   Desire for further Chaplaincy support: welcomed  Prognosis:  Less than 6 months.  Patient is not taking PO well.  I've very  concerned he will dehydrate once his IVF have been discontinued and he discharges    Discharge Planning: to ALF with Palliative      Primary Diagnoses: Present on Admission: . Gastric outflow obstruction   I have reviewed the medical record, interviewed the patient and family, and examined the patient. The following aspects are pertinent.  Past Medical History:  Diagnosis Date  . Alzheimer disease ([place])   . GERD (gastroesophageal reflux disease)   . Hyperlipidemia   . Hypertension   . Tinnitus    Social History   Socioeconomic History  . Marital status: Married  Spouse name: Not on file  . Number of children: Not on file  . Years of education: Not on file  . Highest education level: Not on file  Occupational History  . Not on file  Tobacco Use  . Smoking status: Not on file  . Smokeless tobacco: Not on file  Substance and Sexual Activity  . Alcohol use: Not on file  . Drug use: Not on file  . Sexual activity: Not on file  Other Topics Concern  . Not on file  Social History Narrative  . Not on file   Social Determinants of Health   Financial Resource Strain: Not on file  Food Insecurity: Not on file  Transportation Needs: Not on file  Physical Activity: Not on file  Stress: Not on file  Social Connections: Not on file   History reviewed. No pertinent family history.  Allergies  Allergen Reactions  . Ibuprofen   . Shellfish Allergy      Vital Signs: BP 138/67 (BP Location: Left Arm)   Pulse 61   Temp 98.8 F (37.1 C)   Resp 17   SpO2 100%  Pain Scale: 0-10 POSS *See Group Information*: 1-Acceptable,Awake and alert Pain Score: 0-No pain   SpO2: SpO2: 100 % O2 Device:SpO2: 100 % O2 Flow Rate: .     Palliative Assessment/Data: 40%     Time In: 10:00 Time Out: 11:00 Time Total: 60 min. Visit consisted of counseling and education dealing with the complex and emotionally intense issues surrounding the need for palliative care and  symptom management in the setting of serious and potentially life-threatening illness. Greater than 50%  of this time was spent counseling and coordinating care related to the above assessment and plan.  Signed by: Florentina Jenny, PA-C Palliative Medicine  Please contact Palliative Medicine Team phone at [phone] for questions and concerns.  For individual provider: See Shea Evans             ]

## 2020-08-15 NOTE — Discharge Instructions (Signed)
Disposition.  SNF  Condition.  Guarded CODE STATUS.  DNR Activity.  With assistance as tolerated, full fall precautions. Diet.  Soft with feeding assistance and aspiration precautions. Goal of care.  Comfort.

## 2020-08-15 NOTE — Discharge Summary (Addendum)
Blake Lutz WLN:[id number] DOB: 1946/03/27 DOA: 08/05/2020  PCP: Default, Provider, MD  Admit date: 08/05/2020  Discharge date: 08/15/2020  Admitted From: ALF   Disposition: ALF/SNF with Pall Care   Recommendations for Outpatient Follow-up:   Follow up with PCP in 1-2 weeks  PCP Please obtain BMP/CBC, 2 view CXR in 1week,  (see Discharge instructions)   PCP Please follow up on the following pending results:     Home Health: None  Equipment/Devices: None  Consultations: GI, Pall Care Discharge Condition: [place] CODE STATUS: DNF   Diet Recommendation: Soft diet with feeding assistance and aspiration precautions    Chief Complaint  Patient presents with  . Abdominal Pain     Brief history of present illness from the day of admission and additional interim summary    Patient is a 75 y.o. male dementia, HTN, HLD-who presented with vomiting-further evaluation revealed gastric outlet obstruction.  Significant events: 4/3>> admit from SNF-vomiting-found to have gastric outlet obstruction.  Significant studies: 4/3>> CT abdomen/pelvis: Gastric outlet obstruction.  Antimicrobial therapy: Rocephin: 4/3 x 1 Rocephin: 4/6 x1 Keflex: 4/7>>  Microbiology data: 4/3>> urine culture: E. coli  Procedures : 4/5>> EGD: Grade B esophagitis-from vomiting/NG trauma, gastritis, pylorus patent-no neoplasia/inflammation/stricture seen  Consults: GI, [facility] Course     Gastric outlet obstruction: Resolved-EGD without any tumor-pylorus was patent.  Suspicion that patient may have had limited inflammation from a viral illness-that may have showed up as gastric outlet obstruction on CT imaging.  Continue soft diet as tolerated.  Problem  now with dysphagia and pocketing of food, very poor oral intake, with his worsening dementia long-term prognosis is not good, discussed with wife, plan is to continue gentle medical treatment, soft diet as tolerated, no heroics or invasive procedures.  If there is continued decline then focus on keeping him comfortable with palliative care and hospice.  Dysphagia - pocketing food and meds underlying advanced dementia, for now NPO except Meds, IVF, SLP input, DW wife, [facility] Care consulted for future plan of care, if significant decline wife open to Hospice.  Hypernatremia:  Was initially treated with IV fluids but inconsistently IVs, would not bother him for the placement of IVs, goal of care is comfort.  Esophagitis gastritis: Seen on EGD-thought to be due to either vomiting or NG trauma-continue PPI  Hypokalemia: replaced IV  HTN: BP stable-continue beta-blocker-hold HCTZ.  HLD: Continue statin  E. coli UTI: Hard to tell whether patient is symptomatic given dementia-on Rocephin-has been transitioned to Keflex, finished his course on 08/12/2020 evening.  Dementia with delirium (metabolic encephalopathy): We will continue to have delirium during this hospital stay-he is outside of his  familiar surroundings-but mental status has gradually improved.  No longer with restraints-we will attempt to discontinue safety sitter today.  Remains on Seroquel and Namenda.    Elevated total protein level on admission: Likely a lab error-protein level this morning within normal limits this morning  Discharge diagnosis     Active Problems:   Gastric outflow obstruction   Alzheimer disease (HCC)   Diarrhea   GERD (gastroesophageal reflux disease)   Essential hypertension   Hypernatremia   Paraproteinemia    Discharge instructions    Discharge Instructions    Discharge instructions   Complete by: As directed    Disposition.  SNF  Condition.  Guarded CODE STATUS.  DNR Activity.  With  assistance as tolerated, full fall precautions. Diet.  Soft with feeding assistance and aspiration precautions. Goal of care.  Comfort.      Discharge Medications   Allergies as of 08/15/2020      Reactions   Ibuprofen    Shellfish Allergy       Medication List    STOP taking these medications   famotidine 40 MG tablet Commonly known as: PEPCID   ferrous sulfate 325 (65 FE) MG tablet   hydrochlorothiazide 12.5 MG capsule Commonly known as: MICROZIDE   pravastatin 40 MG tablet Commonly known as: PRAVACHOL     TAKE these medications   magnesium hydroxide 400 MG/5ML suspension Commonly known as: MILK OF MAGNESIA Take 30 mLs by mouth daily as needed for mild constipation.   memantine 5 MG tablet Commonly known as: NAMENDA Take 7.5 mg by mouth 2 (two) times daily. Take 1.5 tabs by mouth twice daily   metoprolol tartrate 25 MG tablet Commonly known as: LOPRESSOR Take 25 mg by mouth 2 (two) times daily.   montelukast 10 MG tablet Commonly known as: SINGULAIR Take 10 mg by mouth at bedtime.   pantoprazole 40 MG tablet Commonly known as: PROTONIX Take 1 tablet (40 mg total) by mouth daily before breakfast.   polyethylene glycol 17 g packet Commonly known as: MIRALAX / GLYCOLAX Take 17 g by mouth See admin instructions. Mix 17g in beverage of choice and take by mouth   potassium chloride 20 MEQ/15ML (10%) Soln Take 20 mEq by mouth daily.   QUEtiapine 25 MG tablet Commonly known as: SEROQUEL Take 37.5 mg by mouth See admin instructions. Take 1.5 tab (37.5 mg) by mouth every morning   QUEtiapine 25 MG tablet Commonly known as: SEROQUEL Take 50 mg by mouth See admin instructions. Take 2 tablets by mouth at 1700   senna-docusate 8.6-50 MG tablet Commonly known as: Senokot-S Take 2 tablets by mouth daily.         Major procedures and Radiology Reports - PLEASE review detailed and final reports thoroughly  -       DG Abd 1 View  Result Date:  08/07/2020 CLINICAL DATA:  NG tube placement. EXAM: ABDOMEN - 1 VIEW COMPARISON:  Prior study same day. FINDINGS: NG tube has been withdrawn and is noted with its tip over the upper chest. NG tube tip could be and the thoracic esophagus or right mainstem bronchus. Repositioning suggested. Nondistended air-filled loops of small bowel noted. No free air. Degenerative changes scoliosis thoracic spine. IMPRESSION: NG tube has been withdrawn and is noted with its tip over the upper chest. NG tube tip could be in the thoracic esophagus or right mainstem bronchus. Repositioning suggested. These results will be called to the ordering clinician or representative by the Radiologist Assistant, and  communication documented in the PACS or [employer]. Electronically Signed   By: Maisie Fus  Register   On: 08/07/2020 06:20   DG Abd 1 View  Result Date: 08/07/2020 CLINICAL DATA:  NG tube placement EXAM: ABDOMEN - 1 VIEW COMPARISON:  08/06/2020 FINDINGS: An enteric tube is present with tip in the left upper quadrant consistent with location in the upper stomach. Proximal side hole is at or just below the expected level of the EG junction. No change in position since previous study. Visualized bowel gas pattern is normal. IMPRESSION: Enteric tube tip is in the left upper quadrant, likely in the upper stomach. Proximal side hole is at or just below the expected level of the GE junction. Electronically Signed   By: Burman Nieves M.D.   On: 08/07/2020 02:22   CT Abdomen Pelvis W Contrast  Result Date: 08/05/2020 CLINICAL DATA:  75 year old male with abdominal distension and pain. EXAM: CT ABDOMEN AND PELVIS WITH CONTRAST TECHNIQUE: Multidetector CT imaging of the abdomen and pelvis was performed using the standard protocol following bolus administration of intravenous contrast. CONTRAST:  OMNIPAQUE IOHEXOL 300 MG/ML  SOLN COMPARISON:  None. FINDINGS: Lower chest: Mild motion artifact and atelectasis. Mild cardiomegaly.  No pericardial effusion. Calcified coronary artery atherosclerosis. Hepatobiliary: Contracted gallbladder. Negative liver. No bile duct enlargement. Pancreas: Partially atrophied, otherwise negative. Spleen: Diminutive and negative; there is a tiny low-density area in the mid body of the spleen on series 4, image 28 which is too small to characterize but likely benign. Adrenals/Urinary Tract: Normal adrenal glands. Symmetric renal enhancement and contrast excretion. Benign appearing right renal posterior midpole cyst. Decompressed ureters. No urinary calculus identified. Diffusely thick-walled urinary bladder (series 4, image 81). No perivesical stranding. Bladder volume within normal limits. Stomach/Bowel: Fluid throughout nondilated large bowel to the rectum. Redundant hepatic flexure and right colon. Motion artifact in the upper abdomen. No discrete large bowel inflammation. Normal appendix on series 4, image 59. Decompressed terminal ileum with fluid-filled but nondilated distal small bowel in the lower abdomen. Decompressed proximal small bowel. Moderately to severely dilated gas and fluid containing stomach. Duodenal bulb similarly dilated with abrupt tapering at the distal bulb, proximal duodenum on coronal image 43. Questionable duodenal wall thickening there, but no perigastric or para duodenal inflammation. Distal duodenum and ligament of Treitz decompressed. No free air.  No free fluid. Vascular/Lymphatic: Aortoiliac calcified atherosclerosis. Tortuous major arteries in the abdomen and pelvis which appear to remain patent. Portal venous system is patent. No lymphadenopathy. Reproductive: Sequelae of prostate brachytherapy. Other: No pelvic free fluid. Musculoskeletal: Chronic appearing L1 superior endplate compression fracture with associated sclerosis. Disc and endplate degeneration elsewhere. No acute osseous abnormality identified. IMPRESSION: 1. Gastric Outlet Obstruction: Moderately to severely  dilated stomach containing gas and fluid with abrupt tapering in the proximal duodenum. Questionable duodenal wall thickening, such that this might be related to inflammatory or postinflammatory stricture. But there is no regional inflammation. Recommend NG tube decompression. 2. Superimposed fluid throughout nondilated large bowel compatible with diarrhea. No discrete large bowel inflammation. Normal appendix. 3. Diffusely thick-walled urinary bladder suggesting cystitis or UTI. Nearby sequelae of prostate brachytherapy. 4. Chronic appearing L1 superior endplate compression fracture. 5. Aortic Atherosclerosis (ICD10-I70.0). Mild cardiomegaly. Coronary artery atherosclerosis. Electronically Signed   By: Odessa Fleming M.D.   On: 08/05/2020 10:17   DG Chest Port 1 View  Result Date: 08/13/2020 CLINICAL DATA:  Shortness of breath EXAM: PORTABLE CHEST 1 VIEW COMPARISON:  None. FINDINGS: Lungs are clear. Heart is upper  normal in size with pulmonary vascularity normal. No adenopathy. There is aortic atherosclerosis. No bone lesions. IMPRESSION: Lungs clear. Heart upper normal in size. Aortic Atherosclerosis (ICD10-I70.0). Electronically Signed   By: Bretta BangWilliam  Woodruff III M.D.   On: 08/13/2020 08:15   DG Abd Portable 1V  Result Date: 08/06/2020 CLINICAL DATA:  Nasogastric tube placement EXAM: PORTABLE ABDOMEN - 1 VIEW COMPARISON:  Portable exam 1317 hours without priors for comparison FINDINGS: Tip of nasogastric tube projects over proximal stomach. Nonobstructive bowel gas pattern. Osseous structures significant only for scattered degenerative disc disease changes. No definite urinary tract calcifications. IMPRESSION: Tip of nasogastric tube projects over proximal stomach. Electronically Signed   By: Ulyses SouthwardMark  Boles M.D.   On: 08/06/2020 15:07   DG Naso G Tube Plc W/Fl W/Rad  Result Date: 08/05/2020 CLINICAL DATA:  Gastric outflow obstruction. EXAM: NASO G TUBE PLACEMENT WITH FL AND WITH RAD FLUOROSCOPY TIME:  Fluoroscopy  Time:  0.9 minutes Number of Acquired Spot Images: 0 COMPARISON:  None. FINDINGS: After numbing the nostril with lidocaine jelly, the NG tube was not certain through the nostril and advanced into the stomach with fluoroscopic guidance. IMPRESSION: Appropriate NG tube placement under fluoroscopic guidance. Electronically Signed   By: Gerome Samavid  Williams III M.D   On: 08/05/2020 18:44    Micro Results     Recent Results (from the past 240 hour(s))  Urine culture     Status: Abnormal   Collection Time: 08/05/20 10:34 AM   Specimen: Urine, Random  Result Value Ref Range Status   Specimen Description URINE, RANDOM  Final   Special Requests   Final    NONE Performed at [facility], [address] [place], [place] [address]    Culture >=100,000 COLONIES/mL ESCHERICHIA COLI (A)  Final   Report Status 08/07/2020 FINAL  Final   Organism ID, Bacteria ESCHERICHIA COLI (A)  Final      Susceptibility   Escherichia coli - MIC*    AMPICILLIN 8 SENSITIVE Sensitive     CEFAZOLIN <=4 SENSITIVE Sensitive     CEFEPIME <=0.12 SENSITIVE Sensitive     CEFTRIAXONE <=0.25 SENSITIVE Sensitive     CIPROFLOXACIN <=0.25 SENSITIVE Sensitive     GENTAMICIN <=1 SENSITIVE Sensitive     IMIPENEM <=0.25 SENSITIVE Sensitive     NITROFURANTOIN <=16 SENSITIVE Sensitive     TRIMETH/SULFA <=20 SENSITIVE Sensitive     AMPICILLIN/SULBACTAM <=2 SENSITIVE Sensitive     PIP/TAZO <=4 SENSITIVE Sensitive     * >=100,000 COLONIES/mL ESCHERICHIA COLI  SARS CORONAVIRUS 2 (TAT 6-24 HRS) Nasopharyngeal Nasopharyngeal Swab     Status: None   Collection Time: 08/05/20 12:04 PM   Specimen: Nasopharyngeal Swab  Result Value Ref Range Status   SARS Coronavirus 2 NEGATIVE NEGATIVE Final    Comment: (NOTE) SARS-CoV-2 target nucleic acids are NOT DETECTED.  The SARS-CoV-2 RNA is generally detectable in upper and lower respiratory specimens during the acute phase of infection. Negative results do not preclude SARS-CoV-2 infection,  do not rule out co-infections with other pathogens, and should not be used as the sole basis for treatment or other patient management decisions. Negative results must be combined with clinical observations, patient history, and epidemiological information. The expected result is Negative.  Fact Sheet for Patients: HairSlick.nohttps://www.fda.gov/media/138098/download  Fact Sheet for Healthcare Providers: quierodirigir.comhttps://www.fda.gov/media/138095/download  This test is not yet approved or cleared by the [place] FDA and  has been authorized for detection and/or diagnosis of SARS-CoV-2 by FDA under an Emergency Use Authorization (  EUA). This EUA will remain  in effect (meaning this test can be used) for the duration of the COVID-19 declaration under Se ction 564(b)(1) of the Act, 21 U.S.C. section 360bbb-3(b)(1), unless the authorization is terminated or revoked sooner.  Performed at [facility], [address] [place], [place] [address]   SARS CORONAVIRUS 2 (TAT 6-24 HRS) Nasopharyngeal Nasopharyngeal Swab     Status: None   Collection Time: 08/12/20  5:33 PM   Specimen: Nasopharyngeal Swab  Result Value Ref Range Status   SARS Coronavirus 2 NEGATIVE NEGATIVE Final    Comment: (NOTE) SARS-CoV-2 target nucleic acids are NOT DETECTED.  The SARS-CoV-2 RNA is generally detectable in upper and lower respiratory specimens during the acute phase of infection. Negative results do not preclude SARS-CoV-2 infection, do not rule out co-infections with other pathogens, and should not be used as the sole basis for treatment or other patient management decisions. Negative results must be combined with clinical observations, patient history, and epidemiological information. The expected result is Negative.  Fact Sheet for Patients: HairSlick.no  Fact Sheet for Healthcare Providers: quierodirigir.com  This test is not yet approved or cleared  by the [place] FDA and  has been authorized for detection and/or diagnosis of SARS-CoV-2 by FDA under an Emergency Use Authorization (EUA). This EUA will remain  in effect (meaning this test can be used) for the duration of the COVID-19 declaration under Se ction 564(b)(1) of the Act, 21 U.S.C. section 360bbb-3(b)(1), unless the authorization is terminated or revoked sooner.  Performed at [facility], [address], [place], [place] [address]     Today   Subjective    Blake Lutz today in bed appears to be in no distress but confused, denies any headache chest or abdominal pain, unreliable conversation.   Objective   Blood pressure 138/67, pulse 61, temperature 98.8 F (37.1 C), resp. rate 17, SpO2 100 %.   Intake/Output Summary (Last 24 hours) at 08/15/2020 0922 Last data filed at 08/15/2020 0643 Gross per 24 hour  Intake 960 ml  Output 1650 ml  Net -690 ml    Exam  Awake but confused, No new F.N deficits,   [place].AT,PERRAL Supple Neck,No JVD, No cervical lymphadenopathy appriciated.  Symmetrical Chest wall movement, Good air movement bilaterally, CTAB RRR,No Gallops,Rubs or new Murmurs, No Parasternal Heave +ve B.Sounds, Abd Soft, Non tender, No organomegaly appriciated, No rebound -guarding or rigidity. No Cyanosis, Clubbing or edema, No new Rash or bruise   Data Review   CBC w Diff:  Lab Results  Component Value Date   WBC 6.8 08/07/2020   HGB 12.3 (L) 08/07/2020   HCT 39.4 08/07/2020   PLT 296 08/07/2020    CMP:  Lab Results  Component Value Date   NA 148 (H) 08/14/2020   K 3.7 08/14/2020   CL 111 08/14/2020   CO2 31 08/14/2020   BUN 10 08/14/2020   CREATININE 0.90 08/14/2020   PROT 7.2 08/07/2020   ALBUMIN 3.2 (L) 08/07/2020   BILITOT 0.9 08/07/2020   ALKPHOS 92 08/07/2020   AST 18 08/07/2020   ALT 14 08/07/2020  .   Total Time in preparing paper work, data evaluation and todays exam - 35 minutes  Susa Raring M.D on  08/15/2020 at 9:22 AM  [facility]

## 2020-08-15 NOTE — Progress Notes (Addendum)
Civil engineer, contracting [facility])  Hospital Liaison RN note         Notified by [facility] manager of patient/family request for [facility]iative services at [facility] after discharge.         [facility] Palliative team will follow up with patient after discharge.         Please call with any hospice or palliative related questions.         Thank you for the opportunity to participate in this patient's care.     Gillian Scarce, BSN, RN [facility] Liaison (listed on [place] under Hospice/[facility])    [phone] (24h on call)

## 2020-09-20 ENCOUNTER — Emergency Department (HOSPITAL_COMMUNITY)
Admission: EM | Admit: 2020-09-20 | Discharge: 2020-09-21 | Disposition: A | Discharge status: Home / Self Care | Attending: Emergency Medicine | Admitting: Emergency Medicine

## 2020-09-20 DIAGNOSIS — I10 Essential (primary) hypertension: Secondary | ICD-10-CM | POA: Insufficient documentation

## 2020-09-20 DIAGNOSIS — Z79899 Other long term (current) drug therapy: Secondary | ICD-10-CM | POA: Diagnosis not present

## 2020-09-20 DIAGNOSIS — E876 Hypokalemia: Secondary | ICD-10-CM | POA: Diagnosis not present

## 2020-09-20 DIAGNOSIS — E870 Hyperosmolality and hypernatremia: Secondary | ICD-10-CM | POA: Diagnosis not present

## 2020-09-20 DIAGNOSIS — G309 Alzheimer's disease, unspecified: Secondary | ICD-10-CM | POA: Insufficient documentation

## 2020-09-20 DIAGNOSIS — D649 Anemia, unspecified: Secondary | ICD-10-CM | POA: Insufficient documentation

## 2020-09-20 DIAGNOSIS — R799 Abnormal finding of blood chemistry, unspecified: Secondary | ICD-10-CM | POA: Diagnosis present

## 2020-09-20 NOTE — ED Provider Notes (Signed)
[facility] EMERGENCY DEPARTMENT Provider Note   CSN: [id number] Arrival date & time: 09/20/20  2324     History Chief Complaint  Patient presents with  . Abnormal Lab    Blake Lutz is a 75 y.o. male.  The history is provided by the EMS personnel. The history is limited by the condition of the patient (Dementia).  Abnormal Lab He has history of hypertension, hyperlipidemia, gastroesophageal reflux disease, Alzheimer's disease and was sent in from a skilled nursing facility because of an abnormal lab.  Unfortunately, EMS states that the nurse who was taking care of him did not know what the lab was that was abnormal and patient is not able to give any history.   Past Medical History:  Diagnosis Date  . Alzheimer disease (HCC)   . GERD (gastroesophageal reflux disease)   . Hyperlipidemia   . Hypertension   . Tinnitus     Patient Active Problem List   Diagnosis Date Noted  . 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 Alzheimer disease (HCC) 08/05/2020  . Diarrhea 08/05/2020  . GERD (gastroesophageal reflux disease) 08/05/2020  . Essential hypertension 08/05/2020  . Hypernatremia 08/05/2020  . 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 ESOPHAGOGASTRODUODENOSCOPY (EGD) WITH PROPOFOL N/A 08/07/2020   Procedure: ESOPHAGOGASTRODUODENOSCOPY (EGD) WITH PROPOFOL;  Surgeon: Sherrilyn Rist, MD;  Location: [facility] ENDOSCOPY;  Service: Gastroenterology;  Laterality: N/A;       No family history on file.     Home Medications Prior to Admission medications   Medication Sig Start Date End Date Taking? Authorizing Provider  magnesium hydroxide (MILK OF MAGNESIA) 400 MG/5ML suspension Take 30 mLs by mouth daily as needed for mild constipation.    [provider]  memantine (NAMENDA) 5 MG tablet Take 7.5 mg by mouth 2 (two) times daily. Take 1.5 tabs by mouth twice daily    [provider]  metoprolol  tartrate (LOPRESSOR) 25 MG tablet Take 25 mg by mouth 2 (two) times daily.    [provider]  montelukast (SINGULAIR) 10 MG tablet Take 10 mg by mouth at bedtime.    [provider]  pantoprazole (PROTONIX) 40 MG tablet Take 1 tablet (40 mg total) by mouth daily before breakfast. 08/15/20   Leroy Sea, MD  polyethylene glycol (MIRALAX / GLYCOLAX) 17 g packet Take 17 g by mouth See admin instructions. Mix 17g in beverage of choice and take by mouth    [provider]  potassium chloride 20 MEQ/15ML (10%) SOLN Take 20 mEq by mouth daily.    [provider]  QUEtiapine (SEROQUEL) 25 MG tablet Take 37.5 mg by mouth See admin instructions. Take 1.5 tab (37.5 mg) by mouth every morning    [provider]  QUEtiapine (SEROQUEL) 25 MG tablet Take 50 mg by mouth See admin instructions. Take 2 tablets by mouth at 1700    [provider]  senna-docusate (SENOKOT-S) 8.6-50 MG tablet Take 2 tablets by mouth daily.    [provider]    Allergies    Ibuprofen and Shellfish allergy  Review of Systems   Review of Systems  Unable to perform ROS: Dementia    Physical Exam Updated Vital Signs BP (!) 156/75 (BP Location: Right Arm)   Pulse 68   Temp 98.3 F (36.8 C) (Axillary)   Resp 18   Ht 5\' 9"  (1.753 m)   Wt 77.1 kg   SpO2 100%  BMI 25.10 kg/m   Physical Exam Vitals and nursing note reviewed.   75 year old male, resting comfortably and in no acute distress. Vital signs are significant for elevated blood pressure. Oxygen saturation is 100%, which is normal. Head is normocephalic and atraumatic. PERRLA, EOMI. Oropharynx is clear. Neck is nontender and supple without adenopathy or JVD. Back is nontender and there is no CVA tenderness. Lungs are clear without rales, wheezes, or rhonchi. Chest is nontender. Heart has regular rate and rhythm without murmur. Abdomen is soft, flat, nontender without masses or hepatosplenomegaly  and peristalsis is normoactive. Extremities have no cyanosis or edema, full range of motion is present. Skin is warm and dry without rash. Neurologic: Awake, oriented to person but not place or time, cranial nerves are intact, moves all extremities equally.  ED Results / Procedures / Treatments   Labs (all labs ordered are listed, but only abnormal results are displayed) Labs Reviewed  CBC WITH DIFFERENTIAL/PLATELET - Abnormal; Notable for the following components:      Result Value   Hemoglobin 12.2 (*)    MCH 24.9 (*)    RDW 16.0 (*)    All other components within normal limits  COMPREHENSIVE METABOLIC PANEL - Abnormal; Notable for the following components:   Sodium 148 (*)    Potassium 2.8 (*)    Albumin 3.2 (*)    Total Bilirubin 0.2 (*)    All other components within normal limits  MAGNESIUM    EKG EKG Interpretation  Date/Time:  Thursday Sep 20 2020 23:36:41 EDT Ventricular Rate:  66 PR Interval:  160 QRS Duration: 89 QT Interval:  460 QTC Calculation: 482 R Axis:   5 Text Interpretation: Sinus rhythm Nonspecific T abnormalities, lateral leads Borderline prolonged QT interval When compared with ECG of 08/05/2020, Premature ventricular complexes are no longer present Confirmed by Dione Booze ([id number]) on 09/20/2020 11:38:07 PM  Procedures Procedures   Medications Ordered in ED Medications  potassium chloride 10 mEq in 100 mL IVPB (has no administration in time range)  potassium chloride SA (KLOR-CON) CR tablet 40 mEq (has no administration in time range)    ED Course  I have reviewed the triage vital signs and the nursing notes.  Pertinent lab results that were available during my care of the patient were reviewed by me and considered in my medical decision making (see chart for details).   MDM Rules/Calculators/A&P                         Report of abnormal lab but no indication what the abnormality is.  Will check CBC and comprehensive metabolic panel and  magnesium.  He is not on any anticoagulants, therefore will not need to check INR.  Old records are reviewed, and he has no relevant past visits.  ECG shows nonspecific T wave flattening, borderline prolonged QT interval.  Magnesium level is normal.  Labs show mild anemia which is unchanged from baseline.  Metabolic panel is significant for potassium of 2.8.  Magnesium is normal.  Cause for hypokalemia is unclear as he is not on any diuretics.  He is also supposed to be taking potassium.  He will be given IV and oral potassium.  He also has noted to have mild hyponatremia which is unchanged from baseline and not felt to be clinically significant.  He is discharged back to his skilled nursing facility with a prescription for K-Dur.  Final Clinical Impression(s) / ED Diagnoses Final diagnoses:  Hypokalemia  Normocytic anemia  Hypernatremia    Rx / DC Orders ED Discharge Orders         Ordered    potassium chloride SA (KLOR-CON) 20 MEQ tablet  2 times daily        09/21/20 0233           Dione Booze, MD 09/21/20 [phone]

## 2020-09-20 NOTE — ED Triage Notes (Signed)
Pt arrived via ems from [facility] facility due to a critical lab. EMS reports that the facility did not tell them the exact lab or value that was critical. Pt has a hx of dementia, Alzheimers, and hypertension. Pt has been sleeping and not really responding to questions when asked.

## 2020-09-21 DIAGNOSIS — E876 Hypokalemia: Secondary | ICD-10-CM | POA: Diagnosis not present

## 2020-09-21 LAB — CBC WITH DIFFERENTIAL/PLATELET
Abs Immature Granulocytes: 0.01 10*3/uL (ref 0.00–0.07)
Basophils Absolute: 0 10*3/uL (ref 0.0–0.1)
Basophils Relative: 1 %
Eosinophils Absolute: 0.2 10*3/uL (ref 0.0–0.5)
Eosinophils Relative: 3 %
HCT: 39.9 % (ref 39.0–52.0)
Hemoglobin: 12.2 g/dL — ABNORMAL LOW (ref 13.0–17.0)
Immature Granulocytes: 0 %
Lymphocytes Relative: 43 %
Lymphs Abs: 2.4 10*3/uL (ref 0.7–4.0)
MCH: 24.9 pg — ABNORMAL LOW (ref 26.0–34.0)
MCHC: 30.6 g/dL (ref 30.0–36.0)
MCV: 81.6 fL (ref 80.0–100.0)
Monocytes Absolute: 0.6 10*3/uL (ref 0.1–1.0)
Monocytes Relative: 10 %
Neutro Abs: 2.4 10*3/uL (ref 1.7–7.7)
Neutrophils Relative %: 43 %
Platelets: 173 10*3/uL (ref 150–400)
RBC: 4.89 MIL/uL (ref 4.22–5.81)
RDW: 16 % — ABNORMAL HIGH (ref 11.5–15.5)
WBC: 5.6 10*3/uL (ref 4.0–10.5)
nRBC: 0 % (ref 0.0–0.2)

## 2020-09-21 LAB — COMPREHENSIVE METABOLIC PANEL
ALT: 11 U/L (ref 0–44)
AST: 16 U/L (ref 15–41)
Albumin: 3.2 g/dL — ABNORMAL LOW (ref 3.5–5.0)
Alkaline Phosphatase: 69 U/L (ref 38–126)
Anion gap: 7 (ref 5–15)
BUN: 10 mg/dL (ref 8–23)
CO2: 31 mmol/L (ref 22–32)
Calcium: 9.3 mg/dL (ref 8.9–10.3)
Chloride: 110 mmol/L (ref 98–111)
Creatinine, Ser: 1.01 mg/dL (ref 0.61–1.24)
GFR, Estimated: >60 mL/min (ref >60)
Glucose, Bld: 96 mg/dL (ref 70–99)
Potassium: 2.8 mmol/L — ABNORMAL LOW (ref 3.5–5.1)
Sodium: 148 mmol/L — ABNORMAL HIGH (ref 135–145)
Total Bilirubin: 0.2 mg/dL — ABNORMAL LOW (ref 0.3–1.2)
Total Protein: 6.6 g/dL (ref 6.5–8.1)

## 2020-09-21 LAB — MAGNESIUM: Magnesium: 2 mg/dL (ref 1.7–2.4)

## 2020-09-21 MED ORDER — POTASSIUM CHLORIDE CRYS ER 20 MEQ PO TBCR: 20.0000 meq | EXTENDED_RELEASE_TABLET | Freq: Two times a day (BID) | ORAL | 0 refills | Status: AC

## 2020-09-21 MED ORDER — POTASSIUM CHLORIDE 10 MEQ/100ML IV SOLN
10.0000 meq | INTRAVENOUS | Status: AC
  Administered 2020-09-21 (×2): 10 meq via INTRAVENOUS
  Filled 2020-09-21 (×2): qty 100

## 2020-09-21 MED ORDER — POTASSIUM CHLORIDE CRYS ER 20 MEQ PO TBCR
40.0000 meq | EXTENDED_RELEASE_TABLET | Freq: Once | ORAL | Status: AC
  Administered 2020-09-21: 40 meq via ORAL
  Filled 2020-09-21: qty 2

## 2020-09-21 NOTE — ED Notes (Signed)
Pt pulled out his iv as well as leads.

## 2020-09-21 NOTE — ED Notes (Signed)
Pt still refusing vitals.  

## 2020-09-21 NOTE — ED Notes (Signed)
Pt refusing keeping vital cords on

## 2020-09-21 NOTE — ED Notes (Signed)
[facility] called  

## 2020-09-21 NOTE — ED Notes (Signed)
I was unable to get a temperature, because I couldn't find an oral thermometer

## 2020-09-21 NOTE — ED Notes (Signed)
Pt pulled off all vital sign cords and got up from bed and attempted to wander. RN informed pt the importance of staying in bed. RN got pt back in bed and cleaned pt up due to pt having a bowel movement

## 2020-10-14 ENCOUNTER — Emergency Department (HOSPITAL_COMMUNITY): Payer: Medicare Other

## 2020-10-14 ENCOUNTER — Inpatient Hospital Stay (HOSPITAL_COMMUNITY)
Admission: EM | Admit: 2020-10-14 | Discharge: 2020-11-02 | DRG: 871 | Disposition: E | Payer: Medicare Other | Source: Skilled Nursing Facility | Attending: Internal Medicine | Admitting: Internal Medicine

## 2020-10-14 ENCOUNTER — Other Ambulatory Visit: Payer: Self-pay

## 2020-10-14 DIAGNOSIS — I10 Essential (primary) hypertension: Secondary | ICD-10-CM | POA: Diagnosis present

## 2020-10-14 DIAGNOSIS — Z886 Allergy status to analgesic agent status: Secondary | ICD-10-CM

## 2020-10-14 DIAGNOSIS — A419 Sepsis, unspecified organism: Principal | ICD-10-CM | POA: Diagnosis present

## 2020-10-14 DIAGNOSIS — Z20822 Contact with and (suspected) exposure to covid-19: Secondary | ICD-10-CM | POA: Diagnosis present

## 2020-10-14 DIAGNOSIS — K219 Gastro-esophageal reflux disease without esophagitis: Secondary | ICD-10-CM | POA: Diagnosis present

## 2020-10-14 DIAGNOSIS — E785 Hyperlipidemia, unspecified: Secondary | ICD-10-CM | POA: Diagnosis present

## 2020-10-14 DIAGNOSIS — E870 Hyperosmolality and hypernatremia: Secondary | ICD-10-CM | POA: Diagnosis not present

## 2020-10-14 DIAGNOSIS — R64 Cachexia: Secondary | ICD-10-CM | POA: Diagnosis present

## 2020-10-14 DIAGNOSIS — G9341 Metabolic encephalopathy: Secondary | ICD-10-CM | POA: Diagnosis present

## 2020-10-14 DIAGNOSIS — Z515 Encounter for palliative care: Secondary | ICD-10-CM

## 2020-10-14 DIAGNOSIS — Z66 Do not resuscitate: Secondary | ICD-10-CM | POA: Diagnosis present

## 2020-10-14 DIAGNOSIS — Z79899 Other long term (current) drug therapy: Secondary | ICD-10-CM

## 2020-10-14 DIAGNOSIS — D696 Thrombocytopenia, unspecified: Secondary | ICD-10-CM | POA: Diagnosis present

## 2020-10-14 DIAGNOSIS — G309 Alzheimer's disease, unspecified: Secondary | ICD-10-CM | POA: Diagnosis present

## 2020-10-14 DIAGNOSIS — F0280 Dementia in other diseases classified elsewhere without behavioral disturbance: Secondary | ICD-10-CM | POA: Diagnosis present

## 2020-10-14 DIAGNOSIS — W19XXXA Unspecified fall, initial encounter: Secondary | ICD-10-CM | POA: Diagnosis present

## 2020-10-14 DIAGNOSIS — R6520 Severe sepsis without septic shock: Secondary | ICD-10-CM | POA: Diagnosis present

## 2020-10-14 DIAGNOSIS — E860 Dehydration: Secondary | ICD-10-CM | POA: Diagnosis present

## 2020-10-14 DIAGNOSIS — K311 Adult hypertrophic pyloric stenosis: Secondary | ICD-10-CM | POA: Diagnosis present

## 2020-10-14 DIAGNOSIS — Z91013 Allergy to seafood: Secondary | ICD-10-CM

## 2020-10-14 DIAGNOSIS — N390 Urinary tract infection, site not specified: Secondary | ICD-10-CM | POA: Diagnosis present

## 2020-10-14 DIAGNOSIS — N179 Acute kidney failure, unspecified: Secondary | ICD-10-CM | POA: Diagnosis present

## 2020-10-14 DIAGNOSIS — R54 Age-related physical debility: Secondary | ICD-10-CM | POA: Diagnosis present

## 2020-10-14 DIAGNOSIS — Z6825 Body mass index (BMI) 25.0-25.9, adult: Secondary | ICD-10-CM

## 2020-10-14 LAB — URINALYSIS, ROUTINE W REFLEX MICROSCOPIC
Bilirubin Urine: NEGATIVE
Glucose, UA: NEGATIVE mg/dL
Ketones, ur: NEGATIVE mg/dL
Nitrite: POSITIVE — AB
Protein, ur: NEGATIVE mg/dL
Specific Gravity, Urine: 1.011 (ref 1.005–1.030)
WBC, UA: >50 WBC/hpf — ABNORMAL HIGH (ref 0–5)
pH: 5 (ref 5.0–8.0)

## 2020-10-14 LAB — CBC WITH DIFFERENTIAL/PLATELET
Abs Immature Granulocytes: 0.04 10*3/uL (ref 0.00–0.07)
Basophils Absolute: 0 10*3/uL (ref 0.0–0.1)
Basophils Relative: 0 %
Eosinophils Absolute: 0 10*3/uL (ref 0.0–0.5)
Eosinophils Relative: 0 %
HCT: 43.2 % (ref 39.0–52.0)
Hemoglobin: 13 g/dL (ref 13.0–17.0)
Immature Granulocytes: 0 %
Lymphocytes Relative: 15 %
Lymphs Abs: 1.7 10*3/uL (ref 0.7–4.0)
MCH: 25.4 pg — ABNORMAL LOW (ref 26.0–34.0)
MCHC: 30.1 g/dL (ref 30.0–36.0)
MCV: 84.4 fL (ref 80.0–100.0)
Monocytes Absolute: 0.8 10*3/uL (ref 0.1–1.0)
Monocytes Relative: 7 %
Neutro Abs: 9 10*3/uL — ABNORMAL HIGH (ref 1.7–7.7)
Neutrophils Relative %: 78 %
Platelets: 142 10*3/uL — ABNORMAL LOW (ref 150–400)
RBC: 5.12 MIL/uL (ref 4.22–5.81)
RDW: 16.3 % — ABNORMAL HIGH (ref 11.5–15.5)
WBC: 11.5 10*3/uL — ABNORMAL HIGH (ref 4.0–10.5)
nRBC: 0 % (ref 0.0–0.2)

## 2020-10-14 LAB — RESP PANEL BY RT-PCR (FLU A&B, COVID) ARPGX2
Influenza A by PCR: NEGATIVE
Influenza B by PCR: NEGATIVE
SARS Coronavirus 2 by RT PCR: NEGATIVE

## 2020-10-14 LAB — COMPREHENSIVE METABOLIC PANEL
ALT: 12 U/L (ref 0–44)
AST: 18 U/L (ref 15–41)
Albumin: 3.1 g/dL — ABNORMAL LOW (ref 3.5–5.0)
Alkaline Phosphatase: 78 U/L (ref 38–126)
BUN: 23 mg/dL (ref 8–23)
CO2: 28 mmol/L (ref 22–32)
Calcium: 9.6 mg/dL (ref 8.9–10.3)
Chloride: >130 mmol/L (ref 98–111)
Creatinine, Ser: 1.56 mg/dL — ABNORMAL HIGH (ref 0.61–1.24)
GFR, Estimated: 45 mL/min — ABNORMAL LOW (ref >60)
Glucose, Bld: 124 mg/dL — ABNORMAL HIGH (ref 70–99)
Potassium: 3.5 mmol/L (ref 3.5–5.1)
Sodium: 171 mmol/L (ref 135–145)
Total Bilirubin: 1 mg/dL (ref 0.3–1.2)
Total Protein: 7.6 g/dL (ref 6.5–8.1)

## 2020-10-14 LAB — PROTIME-INR
INR: 1.2 (ref 0.8–1.2)
Prothrombin Time: 15.6 seconds — ABNORMAL HIGH (ref 11.4–15.2)

## 2020-10-14 LAB — LACTIC ACID, PLASMA: Lactic Acid, Venous: 1.5 mmol/L (ref 0.5–1.9)

## 2020-10-14 LAB — APTT: aPTT: 29 seconds (ref 24–36)

## 2020-10-14 MED ORDER — LACTATED RINGERS IV SOLN
INTRAVENOUS | Status: DC
  Administered 2020-10-14: 1000 mL via INTRAVENOUS

## 2020-10-14 MED ORDER — LACTATED RINGERS IV BOLUS: 500.0000 mL | Freq: Once | INTRAVENOUS | Status: DC

## 2020-10-14 MED ORDER — LACTATED RINGERS IV BOLUS (SEPSIS)
500.0000 mL | Freq: Once | INTRAVENOUS | Status: AC
  Administered 2020-10-15: 500 mL via INTRAVENOUS

## 2020-10-14 MED ORDER — LACTATED RINGERS IV BOLUS (SEPSIS)
1000.0000 mL | Freq: Once | INTRAVENOUS | Status: AC
  Administered 2020-10-14: 1000 mL via INTRAVENOUS

## 2020-10-14 MED ORDER — SODIUM CHLORIDE 0.9 % IV SOLN
2.0000 g | Freq: Once | INTRAVENOUS | Status: AC
  Administered 2020-10-14: 2 g via INTRAVENOUS
  Filled 2020-10-14: qty 2

## 2020-10-14 MED ORDER — CEFEPIME HCL 2 G IJ SOLR: 2.0000 g | Freq: Two times a day (BID) | INTRAMUSCULAR | Status: DC

## 2020-10-14 MED ORDER — ACETAMINOPHEN 650 MG RE SUPP
650.0000 mg | Freq: Once | RECTAL | Status: AC
  Administered 2020-10-15: 650 mg via RECTAL
  Filled 2020-10-14: qty 1

## 2020-10-14 MED ORDER — VANCOMYCIN HCL 1500 MG/300ML IV SOLN
1500.0000 mg | Freq: Once | INTRAVENOUS | Status: AC
  Administered 2020-10-14: 1500 mg via INTRAVENOUS
  Filled 2020-10-14: qty 300

## 2020-10-14 MED ORDER — SODIUM CHLORIDE 0.9 % IV SOLN
2.0000 g | Freq: Two times a day (BID) | INTRAVENOUS | Status: DC
  Administered 2020-10-15: 2 g via INTRAVENOUS
  Filled 2020-10-14: qty 2

## 2020-10-14 MED ORDER — LACTATED RINGERS IV SOLN: INTRAVENOUS | Status: DC

## 2020-10-14 MED ORDER — VANCOMYCIN HCL 1000 MG/200ML IV SOLN: 1000.0000 mg | Freq: Once | INTRAVENOUS | Status: DC

## 2020-10-14 MED ORDER — VANCOMYCIN HCL 750 MG/150ML IV SOLN
750.0000 mg | Freq: Two times a day (BID) | INTRAVENOUS | Status: DC
  Administered 2020-10-15: 750 mg via INTRAVENOUS
  Filled 2020-10-14 (×3): qty 150

## 2020-10-14 MED ORDER — METRONIDAZOLE 500 MG/100ML IV SOLN
500.0000 mg | Freq: Once | INTRAVENOUS | Status: AC
  Administered 2020-10-14: 500 mg via INTRAVENOUS
  Filled 2020-10-14: qty 100

## 2020-10-14 NOTE — ED Notes (Signed)
Unable to get 2nd blood culture, RN informed

## 2020-10-14 NOTE — ED Triage Notes (Signed)
Per EMS pt had a fall 1 day ago approximately. Pt also has a fever for an unknown amount of time. Pt is non verbal and responsive only to painful stimuli.

## 2020-10-14 NOTE — Progress Notes (Signed)
Pharmacy Antibiotic Note  Blake Lutz is a 75 y.o. male admitted on 11-08-2020 with  infection of unknown source .  Pharmacy has been consulted for Cefepime and vancomycin dosing.  WBC 11.5, SCr elevated at 1.56 (BL ~ 1)   Plan: -Cefepime 2 gm IV Q 12 hours -Vancomycin 1500 mg IV load followed by vancomycin 750 mg IV Q 12 hours -Monitor CBC, renal fx, cultures and clinical progress -Vanc trough as indicated   Height: 5\' 9"  (175.3 cm) Weight: 77 kg (169 lb 12.1 oz) IBW/kg (Calculated) : 70.7  Temp (24hrs), Avg:102.7 F (39.3 C), Min:102.7 F (39.3 C), Max:102.7 F (39.3 C)  No results for input(s): WBC, CREATININE, LATICACIDVEN, VANCOTROUGH, VANCOPEAK, VANCORANDOM, GENTTROUGH, GENTPEAK, GENTRANDOM, TOBRATROUGH, TOBRAPEAK, TOBRARND, AMIKACINPEAK, AMIKACINTROU, AMIKACIN in the last 168 hours.  CrCl cannot be calculated (Patient's most recent lab result is older than the maximum 21 days allowed.).    Allergies  Allergen Reactions   Ibuprofen    Shellfish Allergy     Antimicrobials this admission: Cefepime 6/12 >>  Vancomycin 6/12  >>   Dose adjustments this admission:   Microbiology results: 6/12 BCx:  6/12 UCx:     Thank you for allowing pharmacy to be a part of this patient's care.  8/12, PharmD., BCPS, BCCCP Clinical Pharmacist Please refer to [facility] for unit-specific pharmacist

## 2020-10-14 NOTE — ED Provider Notes (Addendum)
[facility] EMERGENCY DEPARTMENT Provider Note   CSN: [id number] Arrival date & time: Nov 12, 2020  1621     History Chief Complaint  Patient presents with   Fever   Fall    EMS states pt had a fall approximately 24 hours ago per the facility, and pt also has a fever.     Erico Stan is a 75 y.o. male.  HPI Patient sent from SNF with baseline of severe dementia and nonverbal at baseline.  Reportedly patient has fever up to 102, unknown time of first onset of fever.  Also he had a fall sometime yesterday without additional details provided.    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     Home Medications Prior to Admission medications   Medication Sig Start Date End Date Taking? Authorizing Provider  magnesium hydroxide (MILK OF MAGNESIA) 400 MG/5ML suspension Take 30 mLs by mouth daily as needed for mild constipation.    [provider]  memantine (NAMENDA) 5 MG tablet Take 7.5 mg by mouth 2 (two) times daily. Take 1.5 tabs by mouth twice daily    [provider]  metoprolol tartrate (LOPRESSOR) 25 MG tablet Take 25 mg by mouth 2 (two) times daily.    [provider]  montelukast (SINGULAIR) 10 MG  tablet Take 10 mg by mouth at bedtime.    [provider]  pantoprazole (PROTONIX) 40 MG tablet Take 1 tablet (40 mg total) by mouth daily before breakfast. 08/15/20   Leroy Sea, MD  polyethylene glycol (MIRALAX / GLYCOLAX) 17 g packet Take 17 g by mouth See admin instructions. Mix 17g in beverage of choice and take by mouth    [provider]  potassium chloride SA (KLOR-CON) 20 MEQ tablet Take 1 tablet (20 mEq total) by mouth 2 (two) times daily. 09/21/20   Dione Booze, MD  QUEtiapine (SEROQUEL) 25 MG tablet Take 37.5 mg by mouth See admin instructions. Take 1.5 tab (37.5 mg) by mouth every morning    [provider]  QUEtiapine (SEROQUEL) 25 MG tablet Take 50 mg by mouth See admin instructions. Take 2 tablets by mouth at 1700    [provider]  senna-docusate (SENOKOT-S) 8.6-50 MG tablet Take 2 tablets by mouth daily.    [provider]  potassium chloride 20 MEQ/15ML (10%) SOLN Take 20 mEq by mouth daily.  09/21/20  [provider]    Allergies    Ibuprofen and Shellfish allergy  Review of Systems   Review of Systems Level 5 caveat cannot obtain review of systems due to patient confusion. Physical Exam Updated Vital Signs BP 139/83   Pulse (!) 119   Temp (!) 102.7 F (39.3 C) (Rectal)   Resp (!) 23   Ht  5\' 9"  (1.753 m)   Wt 77 kg   SpO2 99%   BMI 25.07 kg/m   Physical Exam Constitutional:      Comments: No respiratory distress.  Patient is in fairly good physical condition with some deconditioning.  He is keeping his eyes closed but will flutter his eyelids when I talk to him and make soft unintelligible vocalizations.  HENT:     Head: Normocephalic and atraumatic.     Mouth/Throat:     Mouth: Mucous membranes are dry.     Comments: Airway is clear without secretions.  Mucous membranes are very dry. Eyes:     Comments: Patient not following any commands for assessing extraocular movements.  They are conjugate.   Cardiovascular:     Rate and Rhythm: Regular rhythm. Tachycardia present.  Pulmonary:     Comments: No respiratory distress.  Crackles at both lung bases. Abdominal:     Comments: Abdomen is scaphoid.  Nondistended.  No guarding.  Genitourinary:    Comments: Penis and genital area normal in appearance without rashes. Musculoskeletal:     Comments: No peripheral edema.  Condition of extremities is good.  No appearance of any cellulitis.  Skin:    General: Skin is warm and dry.     Comments: Patient is hot to the touch.  No rash.  Neurological:     Comments: Patient is keeping his eyes closed but seems awake.  He flutters his eyelids and produces some unintelligible vocalizations.  He cannot really follow any commands.  He can however reach out with his hands.     ED Results / Procedures / Treatments   Labs (all labs ordered are listed, but only abnormal results are displayed) Labs Reviewed  COMPREHENSIVE METABOLIC PANEL - Abnormal; Notable for the following components:      Result Value   Sodium 171 (*)    Chloride >130 (*)    Glucose, Bld 124 (*)    Creatinine, Ser 1.56 (*)    Albumin 3.1 (*)    GFR, Estimated 45 (*)    All other components within normal limits  CBC WITH DIFFERENTIAL/PLATELET - Abnormal; Notable for the following components:   WBC 11.5 (*)    MCH 25.4 (*)    RDW 16.3 (*)    Platelets 142 (*)    Neutro Abs 9.0 (*)    All other components within normal limits  PROTIME-INR - Abnormal; Notable for the following components:   Prothrombin Time 15.6 (*)    All other components within normal limits  URINALYSIS, ROUTINE W REFLEX MICROSCOPIC - Abnormal; Notable for the following components:   APPearance HAZY (*)    Hgb urine dipstick SMALL (*)    Nitrite POSITIVE (*)    Leukocytes,Ua LARGE (*)    WBC, UA >50 (*)    Bacteria, UA MANY (*)    All other components within normal limits  RESP PANEL BY RT-PCR (FLU A&B, COVID) ARPGX2  CULTURE, BLOOD (ROUTINE X 2)   CULTURE, BLOOD (ROUTINE X 2)  URINE CULTURE  LACTIC ACID, PLASMA  APTT  LACTIC ACID, PLASMA  BASIC METABOLIC PANEL  I-STAT CHEM 8, ED    EKG EKG Interpretation  Date/Time:  Sunday October 14 2020 16:25:05 EDT Ventricular Rate:  122 PR Interval:  86 QRS Duration: 88 QT Interval:  325 QTC Calculation: 463 R Axis:   23 Text Interpretation: Sinus tachycardia Ventricular premature complex Borderline repolarization abnormality agree. tachycardia since last tracing with increased volage. no STEMI Confirmed  by Arby Barrette [phone]) on 01-Nov-2020 11:14:37 PM  Radiology DG Chest Port 1 View  Result Date: 11-01-2020 CLINICAL DATA:  Question sepsis.  Evaluate for abnormality. EXAM: PORTABLE CHEST 1 VIEW COMPARISON:  Chest x-ray 08/13/2020 FINDINGS: The heart size and mediastinal contours are unchanged. Aortic calcifications. Redemonstration of surgical clip overlying the left base. Associated distant left base atelectasis. No focal consolidation. No pulmonary edema. No pleural effusion. No pneumothorax. No acute osseous abnormality. Degenerative changes of bilateral shoulders. IMPRESSION: No active disease. Electronically Signed   By: Tish Frederickson M.D.   On: 11-01-2020 16:58    Procedures Procedures  CRITICAL CARE Performed by: Arby Barrette   Total critical care time: 30 minutes  Critical care time was exclusive of separately billable procedures and treating other patients.  Critical care was necessary to treat or prevent imminent or life-threatening deterioration.  Critical care was time spent personally by me on the following activities: development of treatment plan with patient and/or surrogate as well as nursing, discussions with consultants, evaluation of patient's response to treatment, examination of patient, obtaining history from patient or surrogate, ordering and performing treatments and interventions, ordering and review of laboratory studies, ordering and review of  radiographic studies, pulse oximetry and re-evaluation of patient's condition.  Medications Ordered in ED Medications  lactated ringers infusion ( Intravenous Stopped 11/01/20 2131)  lactated ringers bolus 1,000 mL (0 mLs Intravenous Stopped November 01, 2020 1853)    And  lactated ringers bolus 1,000 mL (0 mLs Intravenous Stopped 2020-11-01 2219)    And  lactated ringers bolus 500 mL (has no administration in time range)  vancomycin (VANCOREADY) IVPB 750 mg/150 mL (has no administration in time range)  ceFEPIme (MAXIPIME) 2 g in sodium chloride 0.9 % 100 mL IVPB (has no administration in time range)  acetaminophen (TYLENOL) suppository 650 mg (has no administration in time range)  ceFEPIme (MAXIPIME) 2 g in sodium chloride 0.9 % 100 mL IVPB (0 g Intravenous Stopped November 01, 2020 1855)  metroNIDAZOLE (FLAGYL) IVPB 500 mg (0 mg Intravenous Stopped 2020/11/01 1845)  vancomycin (VANCOREADY) IVPB 1500 mg/300 mL (0 mg Intravenous Stopped 2020-11-01 2130)    ED Course  I have reviewed the triage vital signs and the nursing notes.  Pertinent labs & imaging results that were available during my care of the patient were reviewed by me and considered in my medical decision making (see chart for details).    MDM Rules/Calculators/A&P                          Patient presents with fever identified by nursing home staff.  At baseline patient has severe dementia and reportedly is not verbally interactive.  Blood pressure stable however patient has fever of 102.  Analysis is grossly positive.  Patient was tachycardic.  He meets sepsis criteria.  Patient started on fluid resuscitation and antibiotics.  The chemistry has significantly anomalous sodium and chloride numbers.  No significant renal insufficiency or failure in association with this.  These are being redrawn for recheck.  Patient has received fluid resuscitation.  Blood pressure stable.  Will admit for sepsis  Reviewed with the patient's wife.  She confirms patient is  DNR.  She is working with hospice and palliative care.  He is currently in a memory unit and they do not have necessarily all the resources for comfort care for him.  She does wish him to get fluid hydration for electrolyte abnormalities and antibiotics for infection.  Hospice will  be involved tomorrow and helping her decide ongoing level of care. Final Clinical Impression(s) / ED Diagnoses Final diagnoses:  Urinary tract infection without hematuria, site unspecified  Sepsis, due to unspecified organism, unspecified whether acute organ dysfunction present [facility])    Rx / DC Orders ED Discharge Orders     None        Arby Barrette, MD 10/07/2020 1962    Arby Barrette, MD 10/15/20 0003

## 2020-10-15 ENCOUNTER — Inpatient Hospital Stay (HOSPITAL_COMMUNITY): Payer: Medicare Other

## 2020-10-15 ENCOUNTER — Encounter (HOSPITAL_COMMUNITY): Payer: Self-pay | Admitting: Internal Medicine

## 2020-10-15 ENCOUNTER — Other Ambulatory Visit: Payer: Self-pay

## 2020-10-15 DIAGNOSIS — E785 Hyperlipidemia, unspecified: Secondary | ICD-10-CM | POA: Diagnosis present

## 2020-10-15 DIAGNOSIS — A419 Sepsis, unspecified organism: Secondary | ICD-10-CM | POA: Diagnosis present

## 2020-10-15 DIAGNOSIS — N179 Acute kidney failure, unspecified: Secondary | ICD-10-CM | POA: Diagnosis present

## 2020-10-15 DIAGNOSIS — R54 Age-related physical debility: Secondary | ICD-10-CM | POA: Diagnosis present

## 2020-10-15 DIAGNOSIS — Z79899 Other long term (current) drug therapy: Secondary | ICD-10-CM | POA: Diagnosis not present

## 2020-10-15 DIAGNOSIS — N390 Urinary tract infection, site not specified: Secondary | ICD-10-CM

## 2020-10-15 DIAGNOSIS — D696 Thrombocytopenia, unspecified: Secondary | ICD-10-CM | POA: Diagnosis present

## 2020-10-15 DIAGNOSIS — E860 Dehydration: Secondary | ICD-10-CM | POA: Diagnosis present

## 2020-10-15 DIAGNOSIS — W19XXXA Unspecified fall, initial encounter: Secondary | ICD-10-CM | POA: Diagnosis present

## 2020-10-15 DIAGNOSIS — Z66 Do not resuscitate: Secondary | ICD-10-CM | POA: Diagnosis present

## 2020-10-15 DIAGNOSIS — I10 Essential (primary) hypertension: Secondary | ICD-10-CM | POA: Diagnosis present

## 2020-10-15 DIAGNOSIS — G309 Alzheimer's disease, unspecified: Secondary | ICD-10-CM

## 2020-10-15 DIAGNOSIS — E870 Hyperosmolality and hypernatremia: Secondary | ICD-10-CM | POA: Diagnosis present

## 2020-10-15 DIAGNOSIS — Z91013 Allergy to seafood: Secondary | ICD-10-CM | POA: Diagnosis not present

## 2020-10-15 DIAGNOSIS — K311 Adult hypertrophic pyloric stenosis: Secondary | ICD-10-CM | POA: Diagnosis present

## 2020-10-15 DIAGNOSIS — R64 Cachexia: Secondary | ICD-10-CM | POA: Diagnosis present

## 2020-10-15 DIAGNOSIS — K219 Gastro-esophageal reflux disease without esophagitis: Secondary | ICD-10-CM | POA: Diagnosis present

## 2020-10-15 DIAGNOSIS — F0280 Dementia in other diseases classified elsewhere without behavioral disturbance: Secondary | ICD-10-CM | POA: Diagnosis present

## 2020-10-15 DIAGNOSIS — Z886 Allergy status to analgesic agent status: Secondary | ICD-10-CM | POA: Diagnosis not present

## 2020-10-15 DIAGNOSIS — Z6825 Body mass index (BMI) 25.0-25.9, adult: Secondary | ICD-10-CM | POA: Diagnosis not present

## 2020-10-15 DIAGNOSIS — R6520 Severe sepsis without septic shock: Secondary | ICD-10-CM | POA: Diagnosis present

## 2020-10-15 DIAGNOSIS — G9341 Metabolic encephalopathy: Secondary | ICD-10-CM | POA: Diagnosis present

## 2020-10-15 DIAGNOSIS — Z20822 Contact with and (suspected) exposure to covid-19: Secondary | ICD-10-CM | POA: Diagnosis present

## 2020-10-15 DIAGNOSIS — Z515 Encounter for palliative care: Secondary | ICD-10-CM | POA: Diagnosis not present

## 2020-10-15 LAB — CBC
HCT: 42.1 % (ref 39.0–52.0)
Hemoglobin: 12.5 g/dL — ABNORMAL LOW (ref 13.0–17.0)
MCH: 25.3 pg — ABNORMAL LOW (ref 26.0–34.0)
MCHC: 29.7 g/dL — ABNORMAL LOW (ref 30.0–36.0)
MCV: 85.2 fL (ref 80.0–100.0)
Platelets: 120 10*3/uL — ABNORMAL LOW (ref 150–400)
RBC: 4.94 MIL/uL (ref 4.22–5.81)
RDW: 16.2 % — ABNORMAL HIGH (ref 11.5–15.5)
WBC: 15.5 10*3/uL — ABNORMAL HIGH (ref 4.0–10.5)
nRBC: 0 % (ref 0.0–0.2)

## 2020-10-15 LAB — BASIC METABOLIC PANEL WITH GFR
BUN: 21 mg/dL (ref 8–23)
CO2: 25 mmol/L (ref 22–32)
Calcium: 9.7 mg/dL (ref 8.9–10.3)
Chloride: >130 mmol/L (ref 98–111)
Creatinine, Ser: 1.42 mg/dL — ABNORMAL HIGH (ref 0.61–1.24)
GFR, Estimated: 51 mL/min — ABNORMAL LOW
Glucose, Bld: 142 mg/dL — ABNORMAL HIGH (ref 70–99)
Potassium: 3.8 mmol/L (ref 3.5–5.1)
Sodium: 170 mmol/L (ref 135–145)

## 2020-10-15 LAB — CREATININE, SERUM
Creatinine, Ser: 1.5 mg/dL — ABNORMAL HIGH (ref 0.61–1.24)
GFR, Estimated: 48 mL/min — ABNORMAL LOW (ref >60)

## 2020-10-15 LAB — CBG MONITORING, ED: Glucose-Capillary: 173 mg/dL — ABNORMAL HIGH (ref 70–99)

## 2020-10-15 LAB — LACTIC ACID, PLASMA: Lactic Acid, Venous: 2.2 mmol/L (ref 0.5–1.9)

## 2020-10-15 MED ORDER — SODIUM CHLORIDE 0.9 % IV SOLN: 250.0000 mL | INTRAVENOUS | Status: DC | PRN

## 2020-10-15 MED ORDER — ENOXAPARIN SODIUM 40 MG/0.4ML IJ SOSY: 40.0000 mg | PREFILLED_SYRINGE | INTRAMUSCULAR | Status: DC

## 2020-10-15 MED ORDER — ACETAMINOPHEN 10 MG/ML IV SOLN
1000.0000 mg | Freq: Four times a day (QID) | INTRAVENOUS | Status: AC
  Administered 2020-10-15 – 2020-10-16 (×4): 1000 mg via INTRAVENOUS
  Filled 2020-10-15 (×4): qty 100

## 2020-10-15 MED ORDER — HALOPERIDOL LACTATE 2 MG/ML PO CONC
0.5000 mg | ORAL | Status: DC | PRN
  Filled 2020-10-15: qty 0.3

## 2020-10-15 MED ORDER — ACETAMINOPHEN 325 MG PO TABS: 650.0000 mg | ORAL_TABLET | Freq: Four times a day (QID) | ORAL | Status: DC | PRN

## 2020-10-15 MED ORDER — POLYVINYL ALCOHOL 1.4 % OP SOLN: 1.0000 [drp] | Freq: Four times a day (QID) | OPHTHALMIC | Status: DC | PRN

## 2020-10-15 MED ORDER — ACETAMINOPHEN 650 MG RE SUPP
650.0000 mg | Freq: Four times a day (QID) | RECTAL | Status: DC | PRN
  Administered 2020-10-15 (×3): 650 mg via RECTAL
  Filled 2020-10-15 (×3): qty 1

## 2020-10-15 MED ORDER — ONDANSETRON 4 MG PO TBDP: 4.0000 mg | ORAL_TABLET | Freq: Four times a day (QID) | ORAL | Status: DC | PRN

## 2020-10-15 MED ORDER — GLYCOPYRROLATE 0.2 MG/ML IJ SOLN
0.2000 mg | INTRAMUSCULAR | Status: DC | PRN
  Administered 2020-10-15 – 2020-10-16 (×2): 0.2 mg via INTRAVENOUS
  Filled 2020-10-15 (×2): qty 1

## 2020-10-15 MED ORDER — ONDANSETRON HCL 4 MG/2ML IJ SOLN: 4.0000 mg | Freq: Four times a day (QID) | INTRAMUSCULAR | Status: DC | PRN

## 2020-10-15 MED ORDER — HYDROMORPHONE HCL 1 MG/ML IJ SOLN
0.2500 mg | Freq: Three times a day (TID) | INTRAMUSCULAR | Status: DC
  Administered 2020-10-15 – 2020-10-16 (×3): 0.25 mg via INTRAVENOUS
  Filled 2020-10-15 (×3): qty 1

## 2020-10-15 MED ORDER — LORAZEPAM 2 MG/ML IJ SOLN: 0.2500 mg | INTRAMUSCULAR | Status: DC | PRN

## 2020-10-15 MED ORDER — HYDROMORPHONE HCL 1 MG/ML IJ SOLN
0.2500 mg | INTRAMUSCULAR | Status: DC | PRN
  Administered 2020-10-16: 0.5 mg via INTRAVENOUS
  Filled 2020-10-15: qty 1

## 2020-10-15 MED ORDER — HALOPERIDOL LACTATE 5 MG/ML IJ SOLN: 0.5000 mg | INTRAMUSCULAR | Status: DC | PRN

## 2020-10-15 MED ORDER — GLYCOPYRROLATE 1 MG PO TABS
1.0000 mg | ORAL_TABLET | ORAL | Status: DC | PRN
  Filled 2020-10-15: qty 1

## 2020-10-15 MED ORDER — SODIUM CHLORIDE 0.9 % IV SOLN
INTRAVENOUS | Status: DC | PRN
  Administered 2020-10-15: 250 mL via INTRAVENOUS

## 2020-10-15 MED ORDER — HALOPERIDOL 0.5 MG PO TABS
0.5000 mg | ORAL_TABLET | ORAL | Status: DC | PRN
  Filled 2020-10-15: qty 1

## 2020-10-15 MED ORDER — GLYCOPYRROLATE 0.2 MG/ML IJ SOLN: 0.2000 mg | INTRAMUSCULAR | Status: DC | PRN

## 2020-10-15 MED ORDER — LORAZEPAM 2 MG/ML IJ SOLN
0.2500 mg | Freq: Two times a day (BID) | INTRAMUSCULAR | Status: DC
  Administered 2020-10-15 – 2020-10-16 (×2): 0.25 mg via INTRAVENOUS
  Filled 2020-10-15 (×2): qty 1

## 2020-10-15 MED ORDER — LORAZEPAM 1 MG PO TABS: 1.0000 mg | ORAL_TABLET | ORAL | Status: DC | PRN

## 2020-10-15 MED ORDER — SODIUM CHLORIDE 0.9% FLUSH: 3.0000 mL | INTRAVENOUS | Status: DC | PRN

## 2020-10-15 MED ORDER — LABETALOL HCL 5 MG/ML IV SOLN: 5.0000 mg | INTRAVENOUS | Status: DC | PRN

## 2020-10-15 MED ORDER — LORAZEPAM 2 MG/ML PO CONC: 1.0000 mg | ORAL | Status: DC | PRN

## 2020-10-15 MED ORDER — BIOTENE DRY MOUTH MT LIQD: 15.0000 mL | OROMUCOSAL | Status: DC | PRN

## 2020-10-15 MED ORDER — DEXTROSE 5 % IV SOLN: INTRAVENOUS | Status: DC

## 2020-10-15 MED ORDER — SODIUM CHLORIDE 0.9% FLUSH
3.0000 mL | Freq: Two times a day (BID) | INTRAVENOUS | Status: DC
  Administered 2020-10-15 – 2020-10-16 (×3): 3 mL via INTRAVENOUS

## 2020-10-15 NOTE — Plan of Care (Signed)
  Problem: Elimination: Goal: Will not experience complications related to urinary retention Outcome: Progressing   Problem: Pain Managment: Goal: General experience of comfort will improve Outcome: Progressing   

## 2020-10-15 NOTE — Progress Notes (Signed)
Patient admitted to the hospital earlier this morning by Dr. Toniann Fail  Patient seen and examined.  She does wake up to voice.  Speech is difficult to comprehend.  Mucous membranes are dry.  Lungs show bilateral rhonchi  Assessment/plan  Advanced Alzheimer's dementia Severe hypernatremia Dehydration Acute kidney injury Urinary tract infection Thrombocytopenia  Patient admitted to the hospital with fever, cough and decreased p.o. intake.  Patient is a resident of memory care unit.  Work-up in the emergency room included chest x-ray did not show any evidence of pneumonia.  CT head was also unremarkable.  Basic metabolic panel showed severe hyponatremia with a sodium of 170.  Acute kidney injury.  Urinalysis also indicated possible UTI.  He was initially started on IV fluids and intravenous antibiotics.  Patient was seen by palliative care and goals of care conversation was had with patient's wife and his daughter.  Decision was made to transition the patient to comfort care and focus on quality of life.  Family has elected to pursue residential hospice.  He is currently awaiting a bed.  We will continue supportive management.  Appreciate palliative assistance.  [employer]

## 2020-10-15 NOTE — Progress Notes (Signed)
NEW ADMISSION NOTE New Admission Note:   Arrival Method:  Stretcher Mental Orientation: A&OX 1 Telemetry: none Assessment: Completed Skin: see assessment IV: Left AC Pain: none Tubes: condom cath Safety Measures: Safety Fall Prevention Plan has been given, discussed and signed Admission: Completed [facility] Orientation: Patient has been orientated to the room, unit and staff.  Family:  Orders have been reviewed and implemented. Will continue to monitor the patient. Call light has been placed within reach and bed alarm has been activated.   Velia Meyer, RN

## 2020-10-15 NOTE — H&P (Signed)
History and Physical    Blake Lutz ZOX:[id number] DOB: 1945-06-05 DOA: 10/25/2020  PCP: Pcp, No  Patient coming from: Dementia unit.  Chief Complaint: Fever.  History obtained from patient's wife as patient has advanced dementia.  HPI: Blake Lutz is a 75 y.o. male with history of advanced dementia, hypertension was admitted 2 months ago for gastric outlet obstruction was brought to the ER after patient was running a high fever.  A day ago patient also had a fall hit his head.  Did not lose consciousness.  Per patient's wife patient also has been having some productive cough also has not been eating well the last few days.  ED Course: In the ER patient is febrile with temperature 102 F mildly elevated lactic acid tachycardic with UA showing features concerning for UTI.  CT head was unremarkable.  COVID test was negative.  Chest x-ray did not show any infiltrates.  Labs were significant for markedly elevated sodium levels and creatinine also elevated from baseline of normal and it is around 1.5.  Patient was given fluid bolus for sepsis protocol and also admitted for dehydration and hypernatremia.  Review of Systems: As per HPI, rest all negative.   Past Medical History:  Diagnosis Date   Alzheimer disease (HCC)    GERD (gastroesophageal reflux disease)    Hyperlipidemia    Hypertension    Tinnitus     Past Surgical History:  Procedure Laterality Date   ESOPHAGOGASTRODUODENOSCOPY (EGD) WITH PROPOFOL N/A 08/07/2020   Procedure: ESOPHAGOGASTRODUODENOSCOPY (EGD) WITH PROPOFOL;  Surgeon: Sherrilyn Ristanis, Blake L III, MD;  Location: [facility];  Service: Gastroenterology;  Laterality: N/A;     reports that he has never smoked. He has never used smokeless tobacco. No history on file for alcohol use and drug use.  Allergies  Allergen Reactions   Ibuprofen    Shellfish Allergy     History reviewed. No pertinent family history.  Prior to Admission medications   Medication Sig  Start Date End Date Taking? Authorizing Provider  magnesium hydroxide (MILK OF MAGNESIA) 400 MG/5ML suspension Take 30 mLs by mouth daily as needed for mild constipation.    [provider]  memantine (NAMENDA) 5 MG tablet Take 7.5 mg by mouth 2 (two) times daily. Take 1.5 tabs by mouth twice daily    [provider]  metoprolol tartrate (LOPRESSOR) 25 MG tablet Take 25 mg by mouth 2 (two) times daily.    [provider]  montelukast (SINGULAIR) 10 MG tablet Take 10 mg by mouth at bedtime.    [provider]  pantoprazole (PROTONIX) 40 MG tablet Take 1 tablet (40 mg total) by mouth daily before breakfast. 08/15/20   Blake Lutz, Blake K, MD  polyethylene glycol (MIRALAX / GLYCOLAX) 17 g packet Take 17 g by mouth See admin instructions. Mix 17g in beverage of choice and take by mouth    [provider]  potassium chloride SA (KLOR-CON) 20 MEQ tablet Take 1 tablet (20 mEq total) by mouth 2 (two) times daily. 09/21/20   Blake Lutz, David, MD  QUEtiapine (SEROQUEL) 25 MG tablet Take 37.5 mg by mouth See admin instructions. Take 1.5 tab (37.5 mg) by mouth every morning    [provider]  QUEtiapine (SEROQUEL) 25 MG tablet Take 50 mg by mouth See admin instructions. Take 2 tablets by mouth at 1700    [provider]  senna-docusate (SENOKOT-S) 8.6-50 MG tablet Take 2 tablets by mouth daily.    [provider]  potassium  chloride 20 MEQ/15ML (10%) SOLN Take 20 mEq by mouth daily.  09/21/20  [provider]    Physical Exam: Constitutional: Moderately built and nourished. Vitals:   10/11/2020 2215 10/18/2020 2216 10/15/20 0000 10/15/20 0111  BP: 139/83  127/86   Pulse: (!) 117 (!) 119 (!) 122   Resp: 20 (!) 23    Temp:    (!) 101.2 F (38.4 C)  TempSrc:    Rectal  SpO2: 99% 99% 100%   Weight:      Height:       Eyes: Anicteric no pallor. ENMT: No discharge from the ears eyes nose and mouth. Neck: No mass felt.  No neck  rigidity. Respiratory: No rhonchi or crepitations. Cardiovascular: S1-S2 heard. Abdomen: Soft nontender bowel sound present. Musculoskeletal: No edema. Skin: No rash. Neurologic: Patient is at the time of my exam has become more alert.  Repeating the same words again and again.  Not following commands. Psychiatric: Has advanced dementia.   Labs on Admission: I have personally reviewed following labs and imaging studies  CBC: Recent Labs  Lab 10/04/2020 1639  WBC 11.5*  NEUTROABS 9.0*  HGB 13.0  HCT 43.2  MCV 84.4  PLT 142*   Basic Metabolic Panel: Recent Labs  Lab 10/17/2020 1639 10/15/2020 2324  NA 171* 170*  Lutz 3.5 3.8  CL >130* >130*  CO2 28 25  GLUCOSE 124* 142*  BUN 23 21  CREATININE 1.56* 1.42*  CALCIUM 9.6 9.7   GFR: Estimated Creatinine Clearance: 43.6 mL/min (A) (by C-G formula based on SCr of 1.42 mg/dL (H)). Liver Function Tests: Recent Labs  Lab 10/04/2020 1639  AST 18  ALT 12  ALKPHOS 78  BILITOT 1.0  PROT 7.6  ALBUMIN 3.1*   No results for input(s): LIPASE, AMYLASE in the last 168 hours. No results for input(s): AMMONIA in the last 168 hours. Coagulation Profile: Recent Labs  Lab 10/10/2020 1639  INR 1.2   Cardiac Enzymes: No results for input(s): CKTOTAL, CKMB, CKMBINDEX, TROPONINI in the last 168 hours. BNP (last 3 results) No results for input(s): PROBNP in the last 8760 hours. HbA1C: No results for input(s): HGBA1C in the last 72 hours. CBG: No results for input(s): GLUCAP in the last 168 hours. Lipid Profile: No results for input(s): CHOL, HDL, LDLCALC, TRIG, CHOLHDL, LDLDIRECT in the last 72 hours. Thyroid Function Tests: No results for input(s): TSH, T4TOTAL, FREET4, T3FREE, THYROIDAB in the last 72 hours. Anemia Panel: No results for input(s): VITAMINB12, FOLATE, FERRITIN, TIBC, IRON, RETICCTPCT in the last 72 hours. Urine analysis:    Component Value Date/Time   COLORURINE YELLOW 10/03/2020 2214   APPEARANCEUR HAZY (A) 10/22/2020  2214   LABSPEC 1.011 10/29/2020 2214   PHURINE 5.0 10/24/2020 2214   GLUCOSEU NEGATIVE 10/07/2020 2214   HGBUR SMALL (A) 10/08/2020 2214   BILIRUBINUR NEGATIVE 10/29/2020 2214   KETONESUR NEGATIVE 10/17/2020 2214   PROTEINUR NEGATIVE 10/22/2020 2214   NITRITE POSITIVE (A) 10/13/2020 2214   LEUKOCYTESUR LARGE (A) 10/15/2020 2214   Sepsis Labs: @LABRCNTIP (procalcitonin:4,lacticidven:4) ) Recent Results (from the past 240 hour(s))  Resp Panel by RT-PCR (Flu A&B, Covid) Nasopharyngeal Swab     Status: None   Collection Time: 10/03/2020  4:39 PM   Specimen: Nasopharyngeal Swab; Nasopharyngeal(NP) swabs in vial transport medium  Result Value Ref Range Status   SARS Coronavirus 2 by RT PCR NEGATIVE NEGATIVE Final    Comment: (NOTE) SARS-CoV-2 target nucleic acids are NOT DETECTED.  The SARS-CoV-2 RNA is generally detectable  in upper respiratory specimens during the acute phase of infection. The lowest concentration of SARS-CoV-2 viral copies this assay can detect is 138 copies/mL. A negative result does not preclude SARS-Cov-2 infection and should not be used as the sole basis for treatment or other patient management decisions. A negative result may occur with  improper specimen collection/handling, submission of specimen other than nasopharyngeal swab, presence of viral mutation(s) within the areas targeted by this assay, and inadequate number of viral copies(<138 copies/mL). A negative result must be combined with clinical observations, patient history, and epidemiological information. The expected result is Negative.  Fact Sheet for Patients:  BloggerCourse.com  Fact Sheet for Healthcare Providers:  SeriousBroker.it  This test is no t yet approved or cleared by the [place] FDA and  has been authorized for detection and/or diagnosis of SARS-CoV-2 by FDA under an Emergency Use Authorization (EUA). This EUA will remain  in  effect (meaning this test can be used) for the duration of the COVID-19 declaration under Section 564(b)(1) of the Act, 21 U.S.C.section 360bbb-3(b)(1), unless the authorization is terminated  or revoked sooner.       Influenza A by PCR NEGATIVE NEGATIVE Final   Influenza B by PCR NEGATIVE NEGATIVE Final    Comment: (NOTE) The Xpert Xpress SARS-CoV-2/FLU/RSV plus assay is intended as an aid in the diagnosis of influenza from Nasopharyngeal swab specimens and should not be used as a sole basis for treatment. Nasal washings and aspirates are unacceptable for Xpert Xpress SARS-CoV-2/FLU/RSV testing.  Fact Sheet for Patients: BloggerCourse.com  Fact Sheet for Healthcare Providers: SeriousBroker.it  This test is not yet approved or cleared by the [place] FDA and has been authorized for detection and/or diagnosis of SARS-CoV-2 by FDA under an Emergency Use Authorization (EUA). This EUA will remain in effect (meaning this test can be used) for the duration of the COVID-19 declaration under Section 564(b)(1) of the Act, 21 U.S.C. section 360bbb-3(b)(1), unless the authorization is terminated or revoked.  Performed at [facility], [address], [place], [place] [address]      Radiological Exams on Admission: CT HEAD WO CONTRAST  Result Date: 10/15/2020 CLINICAL DATA:  Mental status change of unknown cause. Fell 1 day ago. Fever. Nonverbal and responsive only to painful stimuli. EXAM: CT HEAD WITHOUT CONTRAST TECHNIQUE: Contiguous axial images were obtained from the base of the skull through the vertex without intravenous contrast. COMPARISON:  None. FINDINGS: Brain: Diffuse cerebral atrophy. Ventricular dilatation consistent with central atrophy. Low-attenuation changes in the deep white matter consistent with small vessel ischemia. No abnormal extra-axial fluid collections. No mass effect or midline shift. Gray-white matter  junctions are distinct. Basal cisterns are not effaced. No acute intracranial hemorrhage. Vascular: Mild intracranial arterial vascular calcifications. Skull: Calvarium appears intact. Sinuses/Orbits: Mucosal thickening throughout the paranasal sinuses. Multiple retention cysts in the maxillary antra bilaterally. No acute air-fluid levels. Mastoid air cells are clear. Other: None. IMPRESSION: No acute intracranial abnormalities. Diffuse atrophy and small vessel ischemic changes. Inflammatory changes in the paranasal sinuses. Electronically Signed   By: Burman Nieves M.D.   On: 10/15/2020 01:21   DG Chest Port 1 View  Result Date: 10-20-20 CLINICAL DATA:  Question sepsis.  Evaluate for abnormality. EXAM: PORTABLE CHEST 1 VIEW COMPARISON:  Chest x-ray 08/13/2020 FINDINGS: The heart size and mediastinal contours are unchanged. Aortic calcifications. Redemonstration of surgical clip overlying the left base. Associated distant left base atelectasis. No focal consolidation. No pulmonary edema. No pleural effusion. No pneumothorax.  No acute osseous abnormality. Degenerative changes of bilateral shoulders. IMPRESSION: No active disease. Electronically Signed   By: Tish Frederickson M.D.   On: 10/20/20 16:58    EKG: Independently reviewed.  Sinus tachycardia.  Assessment/Plan Principal Problem:   Hypernatremia Active Problems:   Alzheimer disease (HCC)   Essential hypertension   Acute lower UTI    Possible delving sepsis from UTI for which patient was given fluid bolus and empiric antibiotics started follow cultures. Acute renal failure with hypernatremia likely from dehydration and poor oral intake for which patient was given fluid bolus and will start patient on D5W follow metabolic panel. Thrombocytopenia may be related to infectious process.  Follow CBC closely. Hypertension we will keep patient on as needed IV labetalol for now until patient can reliably eat. Advanced dementia presently more  encephalopathic than baseline.  Had a lengthy discussion with patient's wife at this time plan is to continue IV fluids antibiotics.  If patient condition does not improve plan is to make patient more comfort.   DVT prophylaxis: Lovenox. Code Status: Full code. Family Communication: Patient's wife. Disposition Plan: To be determined. Consults called: Palliative care. Admission status: Inpatient.   Eduard Clos MD [facility] Pager [phone].  If 7PM-7AM, please contact night-coverage [email or web address] Password [facility]  10/15/2020, 1:38 AM

## 2020-10-15 NOTE — Consult Note (Signed)
Consultation Note Date: 10/15/2020   Patient Name: Blake Lutz  DOB: [birth year]  MRN: [id number]  Age / Sex: 75 y.o., male  PCP: Pcp, No Referring Physician: Erick Blinks, MD  Reason for Consultation: Establishing goals of care, Hospice Evaluation, Non pain symptom management, Pain control, and Terminal Care.   Discussion with family for complex medical decision making, end-of-life care, and support.   HPI/Patient Profile: 75 y.o. male  with past medical history of Alzheimer's Dementia (Diagnosed in 2015) who was admitted on 10/10/2020 with dehydration, UTI, and productive cough. ED was informed that patient had a recent fall prior to admission but did not lose consciousness. Patient was given a fluid Bolus and IV antibiotics in the ED for markedly elevated sodium levels, creatinine, and UTI respectively. Patient was previous admitted and palliative services were consulted back in April for hypernatremia, UTI, and acute kidney injury. Palliative care was consulted this admission for symptom management, Goals of care, and complex medical decision making.   Of note, patient was active with [facility] on hospice services at [facility] prior to admission.  Clinical Assessment and Goals of Care: I have reviewed medical records including EPIC notes, labs and imaging, received report from RN, assessed the patient and then called family (Daughter: Lambert Mody and Wife: Wilma) to discuss diagnosis prognosis, GOC, EOL wishes, disposition and options.  [name] is asleep in bed, without the ability to be aroused. He appears in mild distress with audible rhonchi bilaterally. Patient appears to be decompensating secondary to significant medical history, as well as end-stage dementia. Family is not at the bedside but were called to discuss next steps in care and inpatient hospice.   I introduced  Palliative Medicine as specialized medical care for people living with serious illness. It focuses on providing relief from the symptoms and stress of a serious illness. The goal is to improve quality of life for both the patient and the family.   We discussed a brief life review of the patient and then focused on their current illness. The natural disease trajectory and expectations at EOL were discussed. Family is in agreement that they desire [name] to remain comfortable, pain, and symptom free. It was discussed with them that his current treatment at the hospital may not be of benefit significantly due to his on-going co-mordities and decline. It was discussed that [name] will likely only have days to weeks at most to live. Different medical interventions were reviewed and the option for comfort care was explained. Family agrees that we should pursue finding a bed for [name] in an inpatient Hospice facility and for fluids and antibiotics to be stopped at this time.   Discussed the importance of continued conversation with family and the medical providers regarding overall plan of care and treatment options, ensuring decisions are within the context of the patient's values and GOCs.    Questions and concerns were addressed.  A "Gone from my Sight Book" will be left in the patients room for  family to read at their leisure when they visit. The family was encouraged to call with questions or concerns.  PMT will continue to support holistically.    Primary Decision Maker:  NEXT OF KIN - Daughter: Dakai Braithwaite    SUMMARY OF RECOMMENDATIONS    Code Status/Advance Care Planning: DNR Comfort Measures Patient will be discharged from hospital when Inpatient Hospice bed is made available  Unrestricted visitation please.   Symptom Management:  Full comfort measures  Please control pain as needed with PRN pain medications  Stop IV Fluid bolus and IV Antibiotics  Please keep comfortable  prior to transfer to Hospice   Additional Recommendations (Limitations, Scope, Preferences): Full Comfort Care  Palliative Prophylaxis:  Aspiration, Delirium Protocol, Frequent Pain Assessment, Oral Care, and Turn Reposition  Psycho-social/Spiritual:  Desire for further Chaplaincy support: Requested Chaplain to visit patient if possible prior to transfer to hospice  Prognosis:  Days to Weeks at most (Possibly Hours)   Discharge Planning: Hospice facility      Primary Diagnoses: Present on Admission:  Hypernatremia  Alzheimer disease (HCC)  Essential hypertension  Acute lower UTI   I have reviewed the medical record, interviewed the patient and family, and examined the patient. The following aspects are pertinent.  Past Medical History:  Diagnosis Date   Alzheimer disease (HCC)    GERD (gastroesophageal reflux disease)    Hyperlipidemia    Hypertension    Tinnitus    Social History   Socioeconomic History   Marital status: Married    Spouse name: Not on file   Number of children: Not on file   Years of education: Not on file   Highest education level: Not on file  Occupational History   Not on file  Tobacco Use   Smoking status: Never   Smokeless tobacco: Never  Substance and Sexual Activity   Alcohol use: Not on file   Drug use: Not on file   Sexual activity: Not on file  Other Topics Concern   Not on file  Social History Narrative   Not on file   Social Determinants of Health   Financial Resource Strain: Not on file  Food Insecurity: Not on file  Transportation Needs: Not on file  Physical Activity: Not on file  Stress: Not on file  Social Connections: Not on file   History reviewed. No pertinent family history.  Allergies  Allergen Reactions   Ibuprofen    Shellfish Allergy     Vital Signs: BP 107/72   Pulse (!) 117   Temp 99.5 F (37.5 C) (Rectal)   Resp (!) 25   Ht 5\' 9"  (1.753 m)   Wt 77 kg   SpO2 100%   BMI 25.07 kg/m  Pain  Scale: 0-10   Pain Score: Asleep   SpO2: SpO2: 100 % O2 Device:SpO2: 100 % O2 Flow Rate: .O2 Flow Rate (L/min): 2 L/min    Palliative Assessment/Data: 10% PPS     Time In: 10:00 Time Out: 11:30 Time Total:  90 min. Visit consisted of counseling and education dealing with the complex and emotionally intense issues surrounding the need for palliative care and symptom management in the setting of serious and potentially life-threatening illness. Greater than 50%  of this time was spent counseling and coordinating care related to the above assessment and plan.  Signed by: , PA-C Palliative Medicine Norvel Richards, PA-S2  Please contact Palliative Medicine Team phone at [phone] for questions and concerns.  For individual provider: See  Amion

## 2020-10-15 NOTE — Progress Notes (Signed)
   Referral received from Norvel Richards NP with [facility]. She reports that pt is w/ ES Dementia and hypernatremia. She has had discussion with family and they want to pursue transferring to the Hospice Home in [facility] for EOL care.   I have spoke to the Daughter Kennith Center and the pt's wife Marylouise Stacks both to confirm interest explain hospice services and they are interested in the bed. We are unable to offer a bed today due to capacity. Once we have an opening I will reach out so that we can proceed with d/c plan.   Norm Parcel RN [phone]

## 2020-10-15 NOTE — ED Notes (Signed)
Pt went to CT

## 2020-10-16 DIAGNOSIS — Z66 Do not resuscitate: Secondary | ICD-10-CM | POA: Diagnosis present

## 2020-10-16 DIAGNOSIS — A419 Sepsis, unspecified organism: Principal | ICD-10-CM

## 2020-10-16 DIAGNOSIS — Z515 Encounter for palliative care: Secondary | ICD-10-CM

## 2020-10-16 DIAGNOSIS — G9341 Metabolic encephalopathy: Secondary | ICD-10-CM | POA: Diagnosis present

## 2020-10-16 MED ORDER — HYDROMORPHONE HCL 1 MG/ML IJ SOLN: 0.5000 mg | Freq: Three times a day (TID) | INTRAMUSCULAR | Status: DC

## 2020-10-17 LAB — URINE CULTURE: Culture: 2000 — AB

## 2020-10-19 LAB — CULTURE, BLOOD (ROUTINE X 2)
Culture: NO GROWTH
Special Requests: ADEQUATE

## 2020-10-20 LAB — CULTURE, BLOOD (ROUTINE X 2)
Culture: NO GROWTH
Special Requests: ADEQUATE

## 2020-11-02 NOTE — Progress Notes (Signed)
Nutrition Brief Note  Chart reviewed. Pt now transitioning to comfort care.  No further nutrition interventions planned at this time.  Please re-consult as needed.   Lachandra Dettmann, MS, RD, LDN RD pager number and weekend/on-call pager number located in Amion.    

## 2020-11-02 NOTE — Progress Notes (Signed)
This chaplain responded to the unit's page sharing the Pt. has died and the family is requesting a spiritual care presence.  The Pt. wife-Wilma, brother-Jim and sister in law-Nancy are at the Pt. bedside.  The time together celebrates the Pt. legacy through story telling and the sharing of photos, which was one of the Pt. hobbies.  The family expressed their faith and thanksgiving in the Pt. having arrived at a place of peace.  The chaplain understands Marylouise Stacks will update the RN if she expects any additional visitors.  The family accepted the chaplain's invitation for prayer and companionship as needed.

## 2020-11-02 NOTE — Progress Notes (Signed)
Daily Progress Note   Patient Name: Blake Lutz       Date: 10/06/2020 DOB: 18-Dec-1945  Age: 75 y.o. MRN#: [id number] Attending Physician: Erick Blinks, MD Primary Care Physician: Pcp, No Admit Date: 10-31-20  Reason for Consultation/Follow-up: Terminal Care  Subjective: Patient does not wake to my voice this morning. Spouse at bedside. Awaiting residential hospice.  Spouse requests assistance contacting [facility] to inform them that patient will not be returning.   Review of Systems  Unable to perform ROS: Acuity of condition   Length of Stay: 1  Current Medications: Scheduled Meds:    HYDROmorphone (DILAUDID) injection  0.5 mg Intravenous Q8H   LORazepam  0.25 mg Intravenous BID   sodium chloride flush  3 mL Intravenous Q12H    Continuous Infusions:  sodium chloride     sodium chloride 250 mL (10/15/20 2027)   acetaminophen 1,000 mg (10/27/2020 0802)    PRN Meds: sodium chloride, sodium chloride, acetaminophen **OR** acetaminophen, antiseptic oral rinse, glycopyrrolate **OR** glycopyrrolate **OR** glycopyrrolate, haloperidol **OR** haloperidol **OR** haloperidol lactate, HYDROmorphone (DILAUDID) injection, LORazepam **OR** LORazepam **OR** LORazepam, ondansetron **OR** ondansetron (ZOFRAN) IV, polyvinyl alcohol, sodium chloride flush  Physical Exam Vitals and nursing note reviewed.  Constitutional:      Comments: Frail, cachectic   Cardiovascular:     Rate and Rhythm: Normal rate and regular rhythm.  Pulmonary:     Comments: Slightly labored Neurological:     Comments: Does not respond            Vital Signs: BP 107/72 (BP Location: Left Arm)   Pulse (!) 102   Temp 98.4 F (36.9 C) (Oral)   Resp 19   Ht 5\' 9"  (1.753 m)   Wt 77 kg   SpO2 96%    BMI 25.07 kg/m  SpO2: SpO2: 96 % O2 Device: O2 Device: Nasal Cannula O2 Flow Rate: O2 Flow Rate (L/min): 2 L/min  Intake/output summary:  Intake/Output Summary (Last 24 hours) at 10/05/2020 1038 Last data filed at 10/09/2020 1000 Gross per 24 hour  Intake 226.87 ml  Output 1450 ml  Net -1223.13 ml   LBM:   Baseline Weight: Weight: 77 kg Most recent weight: Weight: 77 kg       Palliative Assessment/Data: PPS: 10%      Patient Active Problem List  Diagnosis Date Noted   Acute lower UTI 10/15/2020   Gastric outflow obstruction 08/05/2020   Alzheimer disease (HCC) 08/05/2020   Diarrhea 08/05/2020   GERD (gastroesophageal reflux disease) 08/05/2020   Essential hypertension 08/05/2020   Hypernatremia 08/05/2020   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    Palliative Care Assessment & Plan   Patient Profile: 75 y.o. male  with past medical history of Alzheimer's Dementia (Diagnosed in 2015) who was admitted on 10/07/2020 with dehydration, UTI, and productive cough. ED was informed that patient had a recent fall prior to admission but did not lose consciousness. Patient was given a fluid Bolus and IV antibiotics in the ED for markedly elevated sodium levels, creatinine, and UTI respectively. Patient was previous admitted and palliative services were consulted back in April for hypernatremia, UTI, and acute kidney injury. Palliative care was consulted this admission for symptom management, Goals of care, and complex medical decision making.    Of note, patient was active with [facility] on hospice services at [facility] prior to admission.  Assessment/Recommendations/Plan  Requested RN give prn dose dilaudid now for labored breathing Increase scheduled dilaudid to .5mg  q8hrs Continue other comfort measures as ordered  Called [facility] to inform them of patient's status  Goals of Care and Additional Recommendations: Limitations on Scope of Treatment: Full Comfort  Care  Code Status: DNR  Prognosis:  Hours - Days  Discharge Planning: Hospice facility  Care plan was discussed with patient's spouse and RN  Thank you for allowing the Palliative Medicine Team to assist in the care of this patient.   Total time: 39 mins Greater than 50%  of this time was spent counseling and coordinating care related to the above assessment and plan.  , AGNP-C Palliative Medicine   Please contact Palliative Medicine Team phone at [phone] for questions and concerns.

## 2020-11-02 NOTE — Progress Notes (Signed)
Pt. Expired at 1433. Palliative notified, Attending notified, Chaplain called. Family at bedside.

## 2020-11-02 NOTE — Care Management (Signed)
No bed available today at [facility] in [facility]

## 2020-11-02 NOTE — Progress Notes (Signed)
   Nov 01, 2020 1136  Clinical Encounter Type  Visited With Patient and family together  Visit Type Spiritual support;Patient actively dying  Referral From Nurse  Consult/Referral To Chaplain  Chaplain responded to consult for Mr. Strebeck and his Wife.  Mrs. Lasch was at bedside.  Offered comfort and support.  Mrs. Clarene Duke was appreciative of the visit, and she stated if she needs the chaplain, she will tell the nurse. Chaplain was not needed at this time.   Chaplain Joshlynn Alfonzo Morgan-Simpson [phone]

## 2020-11-02 NOTE — Progress Notes (Signed)
This chaplain responded to PMT consult to offer a spiritual care presence as the Pt. is approaching end of life. The chaplain is appreciative of the RN-Shawn's update.   The Pt. wife-Wilma arrived on the unit before the chaplain left.  The chaplain introduced herself and offered a pastoral presence in the quiet space.  The chaplain understands the Pt. experiences the world with a sense of curiosity in all his actions. Wilma shared family will continue to visit the Pt. today.  The chaplain's invitation for prayer was accepted along with F/U spiritual care.  Chaplain Stephanie Acre [phone]

## 2020-11-02 NOTE — Death Summary Note (Signed)
DEATH SUMMARY   Patient Details  Name: Blake Lutz MRN: [id number] DOB: 02/20/1946  Admission/Discharge Information   Admit Date:  11/04/2020  Date of Death: Date of Death: 2020/11/06  Time of Death: Time of Death: 1431/07/27  Length of Stay: 1  Referring Physician: Pcp, No   Reason(s) for Hospitalization  Sepsis due to UTI  Diagnoses  Preliminary cause of death:   Sepsis Secondary Diagnoses (including complications and co-morbidities):  Principal Problem:   Hypernatremia Active Problems:   Alzheimer disease ([place])   Essential hypertension   Acute lower UTI   DNR (do not resuscitate)   Acute metabolic encephalopathy   Hospice care patient   Severe sepsis [facility])   Brief Hospital Course (including significant findings, care, treatment, and services provided and events leading to death)  Blake Lutz is a 75 y.o. year old male who is a resident of [facility] memory care who is being followed by hospice services and was DNR on admission.  Patient was noted to have advanced dementia, and was brought to the hospital with fever and worsening mental status.  He was noted to be severely hypernatremic with a sodium of 170.  He was febrile on arrival, tachycardic had elevated WBC count and elevated lactic acid.  He was extremely dehydrated.  Urinalysis indicated possible infection.  He was initially started on antibiotics and IV fluids.  He was seen by palliative care met with the family and further delineated goals of care.  Family elected to pursue comfort measures.  He was admitted to the hospital for supportive management until a bed at residential hospice became available.  The patient did pass away while he was in the hospital.  Family was at bedside.    Pertinent Labs and Studies  Significant Diagnostic Studies CT HEAD WO CONTRAST  Result Date: 10/15/2020 CLINICAL DATA:  Mental status change of unknown cause. Fell 1 day ago. Fever. Nonverbal and responsive only to painful  stimuli. EXAM: CT HEAD WITHOUT CONTRAST TECHNIQUE: Contiguous axial images were obtained from the base of the skull through the vertex without intravenous contrast. COMPARISON:  None. FINDINGS: Brain: Diffuse cerebral atrophy. Ventricular dilatation consistent with central atrophy. Low-attenuation changes in the deep white matter consistent with small vessel ischemia. No abnormal extra-axial fluid collections. No mass effect or midline shift. Gray-white matter junctions are distinct. Basal cisterns are not effaced. No acute intracranial hemorrhage. Vascular: Mild intracranial arterial vascular calcifications. Skull: Calvarium appears intact. Sinuses/Orbits: Mucosal thickening throughout the paranasal sinuses. Multiple retention cysts in the maxillary antra bilaterally. No acute air-fluid levels. Mastoid air cells are clear. Other: None. IMPRESSION: No acute intracranial abnormalities. Diffuse atrophy and small vessel ischemic changes. Inflammatory changes in the paranasal sinuses. Electronically Signed   By: Lucienne Capers M.D.   On: 10/15/2020 01:21   DG Chest Port 1 View  Result Date: 2020-11-04 CLINICAL DATA:  Question sepsis.  Evaluate for abnormality. EXAM: PORTABLE CHEST 1 VIEW COMPARISON:  Chest x-ray 08/13/2020 FINDINGS: The heart size and mediastinal contours are unchanged. Aortic calcifications. Redemonstration of surgical clip overlying the left base. Associated distant left base atelectasis. No focal consolidation. No pulmonary edema. No pleural effusion. No pneumothorax. No acute osseous abnormality. Degenerative changes of bilateral shoulders. IMPRESSION: No active disease. Electronically Signed   By: Iven Finn M.D.   On: 11-04-20 16:58    Microbiology Recent Results (from the past 240 hour(s))  Blood Culture (routine x 2)     Status: None (Preliminary result)   Collection Time: November 04, 2020  4:25 PM   Specimen: BLOOD  Result Value Ref Range Status   Specimen Description BLOOD LEFT  ANTECUBITAL  Final   Special Requests   Final    BOTTLES DRAWN AEROBIC ONLY Blood Culture adequate volume   Culture   Final    NO GROWTH 2 DAYS Performed at [facility], [address] [place], [place] [address]    Report Status PENDING  Incomplete  Resp Panel by RT-PCR (Flu A&B, Covid) Nasopharyngeal Swab     Status: None   Collection Time: 10/19/2020  4:39 PM   Specimen: Nasopharyngeal Swab; Nasopharyngeal(NP) swabs in vial transport medium  Result Value Ref Range Status   SARS Coronavirus 2 by RT PCR NEGATIVE NEGATIVE Final    Comment: (NOTE) SARS-CoV-2 target nucleic acids are NOT DETECTED.  The SARS-CoV-2 RNA is generally detectable in upper respiratory specimens during the acute phase of infection. The lowest concentration of SARS-CoV-2 viral copies this assay can detect is 138 copies/mL. A negative result does not preclude SARS-Cov-2 infection and should not be used as the sole basis for treatment or other patient management decisions. A negative result may occur with  improper specimen collection/handling, submission of specimen other than nasopharyngeal swab, presence of viral mutation(s) within the areas targeted by this assay, and inadequate number of viral copies(<138 copies/mL). A negative result must be combined with clinical observations, patient history, and epidemiological information. The expected result is Negative.  Fact Sheet for Patients:  EntrepreneurPulse.com.au  Fact Sheet for Healthcare Providers:  IncredibleEmployment.be  This test is no t yet approved or cleared by the [place] FDA and  has been authorized for detection and/or diagnosis of SARS-CoV-2 by FDA under an Emergency Use Authorization (EUA). This EUA will remain  in effect (meaning this test can be used) for the duration of the COVID-19 declaration under Section 564(b)(1) of the Act, 21 U.S.C.section 360bbb-3(b)(1), unless the authorization is  terminated  or revoked sooner.       Influenza A by PCR NEGATIVE NEGATIVE Final   Influenza B by PCR NEGATIVE NEGATIVE Final    Comment: (NOTE) The Xpert Xpress SARS-CoV-2/FLU/RSV plus assay is intended as an aid in the diagnosis of influenza from Nasopharyngeal swab specimens and should not be used as a sole basis for treatment. Nasal washings and aspirates are unacceptable for Xpert Xpress SARS-CoV-2/FLU/RSV testing.  Fact Sheet for Patients: EntrepreneurPulse.com.au  Fact Sheet for Healthcare Providers: IncredibleEmployment.be  This test is not yet approved or cleared by the [place] FDA and has been authorized for detection and/or diagnosis of SARS-CoV-2 by FDA under an Emergency Use Authorization (EUA). This EUA will remain in effect (meaning this test can be used) for the duration of the COVID-19 declaration under Section 564(b)(1) of the Act, 21 U.S.C. section 360bbb-3(b)(1), unless the authorization is terminated or revoked.  Performed at [facility], [place] [address], [place], [place] [address]   Blood Culture (routine x 2)     Status: None (Preliminary result)   Collection Time: 10/24/2020 11:24 PM   Specimen: BLOOD  Result Value Ref Range Status   Specimen Description BLOOD RIGHT ANTECUBITAL  Final   Special Requests   Final    BOTTLES DRAWN AEROBIC AND ANAEROBIC Blood Culture adequate volume   Culture   Final    NO GROWTH 1 DAY Performed at [facility], [place] [address] [place], [place] [address]    Report Status PENDING  Incomplete  Urine culture  Status: Abnormal (Preliminary result)   Collection Time: 10/21/2020 11:24 PM   Specimen: In/Out Cath Urine  Result Value Ref Range Status   Specimen Description IN/OUT CATH URINE  Final   Special Requests NONE  Final   Culture (A)  Final    2,000 COLONIES/mL ESCHERICHIA COLI SUSCEPTIBILITIES TO FOLLOW Performed at [facility], [place] [address],  [place], [place] [address]    Report Status PENDING  Incomplete    Lab Basic Metabolic Panel: Recent Labs  Lab 10/18/2020 1639 10/07/2020 2324 10/15/20 0252  NA 171* 170*  --   K 3.5 3.8  --   CL >130* >130*  --   CO2 28 25  --   GLUCOSE 124* 142*  --   BUN 23 21  --   CREATININE 1.56* 1.42* 1.50*  CALCIUM 9.6 9.7  --    Liver Function Tests: Recent Labs  Lab 10/31/2020 1639  AST 18  ALT 12  ALKPHOS 78  BILITOT 1.0  PROT 7.6  ALBUMIN 3.1*   No results for input(s): LIPASE, AMYLASE in the last 168 hours. No results for input(s): AMMONIA in the last 168 hours. CBC: Recent Labs  Lab 10/28/2020 1639 10/15/20 0252  WBC 11.5* 15.5*  NEUTROABS 9.0*  --   HGB 13.0 12.5*  HCT 43.2 42.1  MCV 84.4 85.2  PLT 142* 120*   Cardiac Enzymes: No results for input(s): CKTOTAL, CKMB, CKMBINDEX, TROPONINI in the last 168 hours. Sepsis Labs: Recent Labs  Lab 10/26/2020 1639 10/11/2020 2324 10/15/20 0252  WBC 11.5*  --  15.5*  LATICACIDVEN 1.5 2.2*  --     Procedures/Operations     [employer] 10-31-20, 8:06 PM

## 2020-11-02 DEATH — deceased

## 2022-02-22 IMAGING — CT CT ABD-PELV W/ CM
2 of 5 series · 15 of 46 positions shown, 17 images · IV contrast (omnipaque)
Comparison: None.

CLINICAL DATA: 74-year-old male with abdominal distension and pain.

EXAM:
CT ABDOMEN AND PELVIS WITH CONTRAST
TECHNIQUE: Multidetector CT imaging of the abdomen and pelvis was performed
using the standard protocol following bolus administration of
intravenous contrast.
CONTRAST:  100mL OMNIPAQUE IOHEXOL 300 MG/ML  SOLN

[Series 4: abdomen 5.0 · axial · 0.69mm/px · z∈[+781,+1206]mm · 12 of 99 slices shown, 14 images]
[im 7/99  soft-tissue]
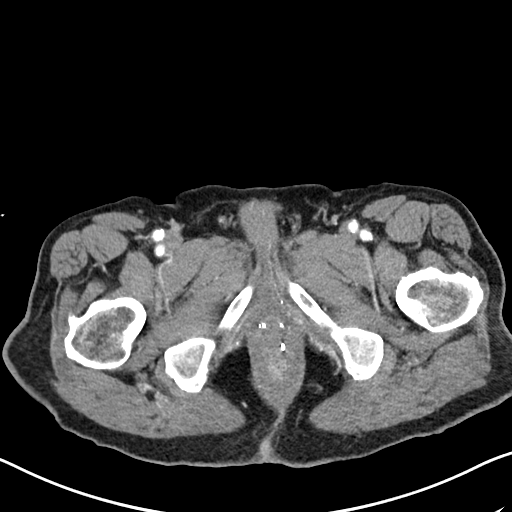
[im 7/99  bone]
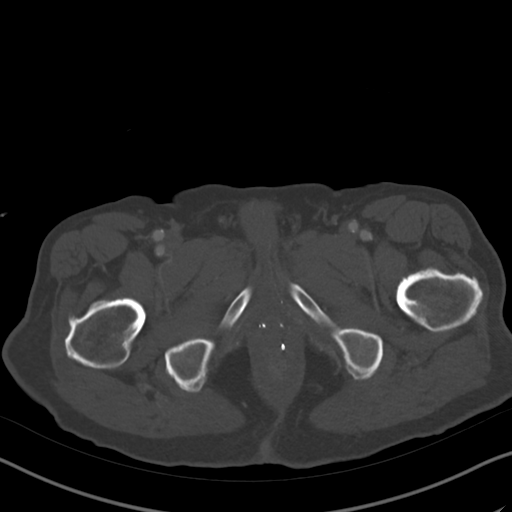
[im 14/99  soft-tissue]
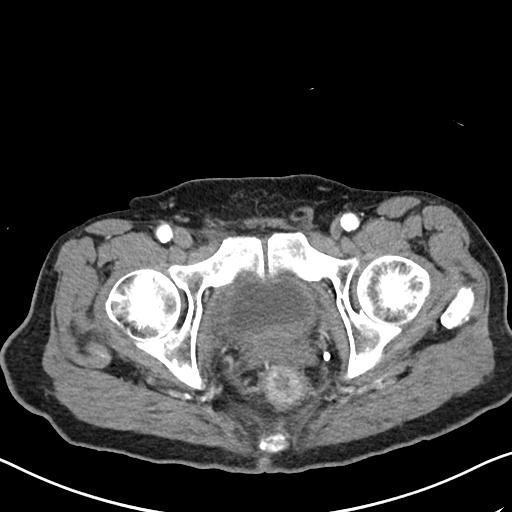
[im 20/99  soft-tissue]
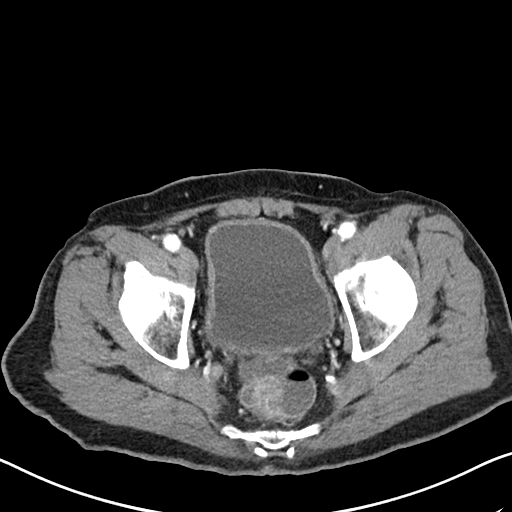
[im 33/99  soft-tissue]
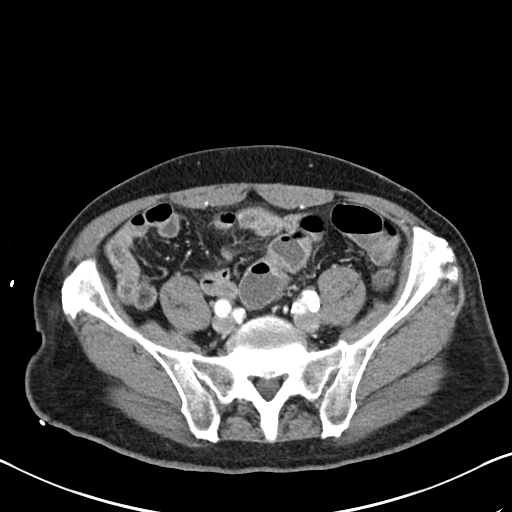
[im 40/99  soft-tissue]
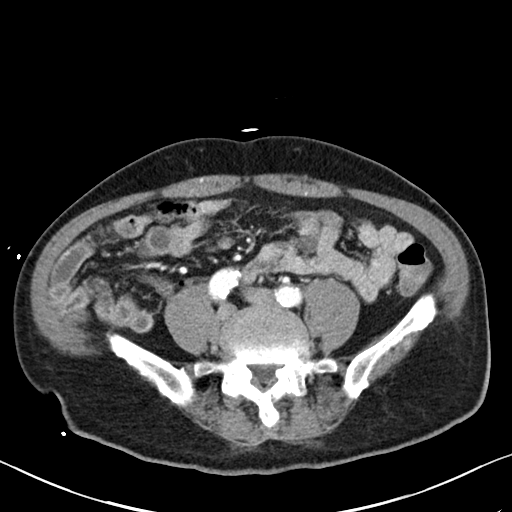
[im 46/99  soft-tissue]
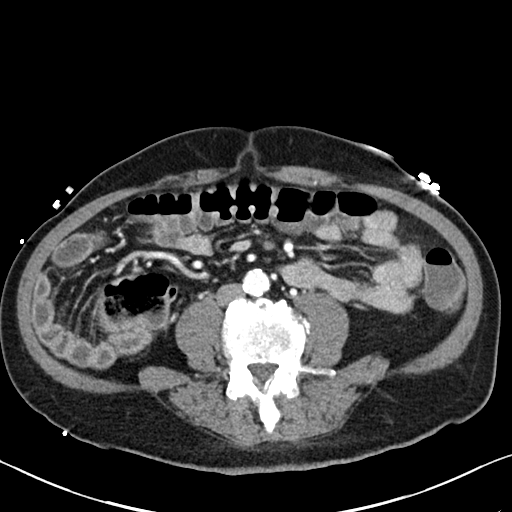
[im 53/99  soft-tissue]
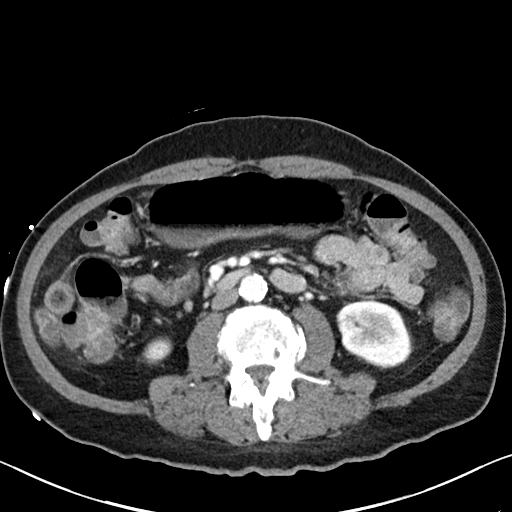
[im 59/99  soft-tissue]
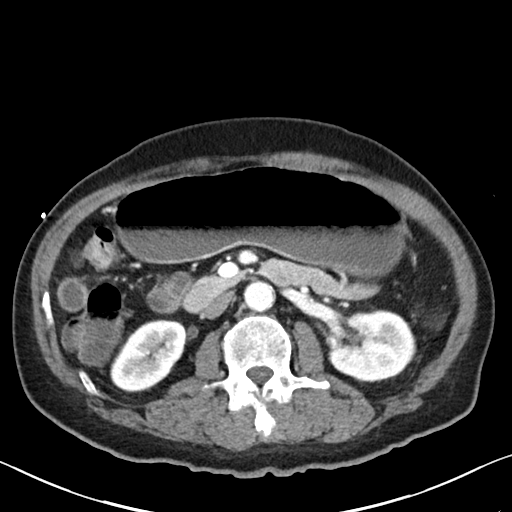
[im 66/99  soft-tissue]
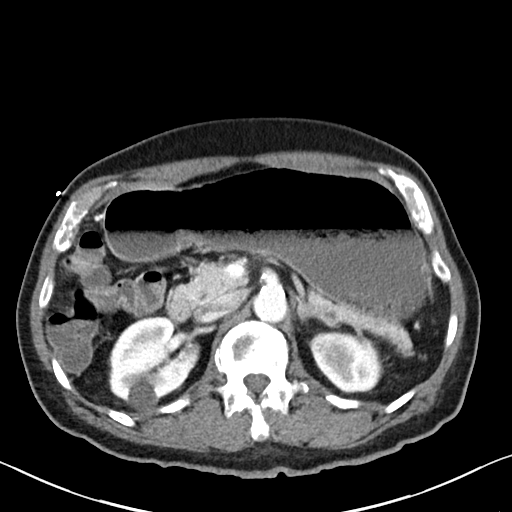
[im 66/99  bone]
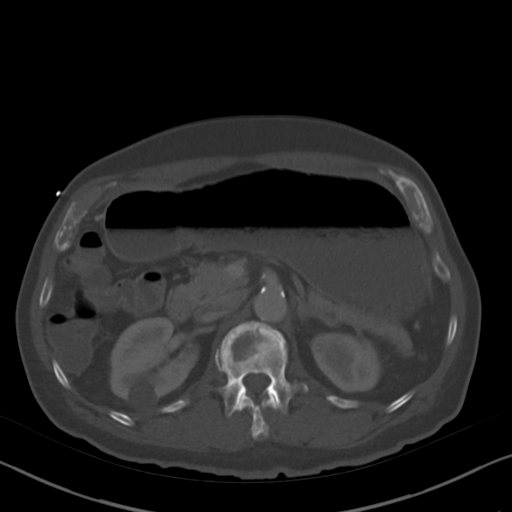
[im 79/99  soft-tissue]
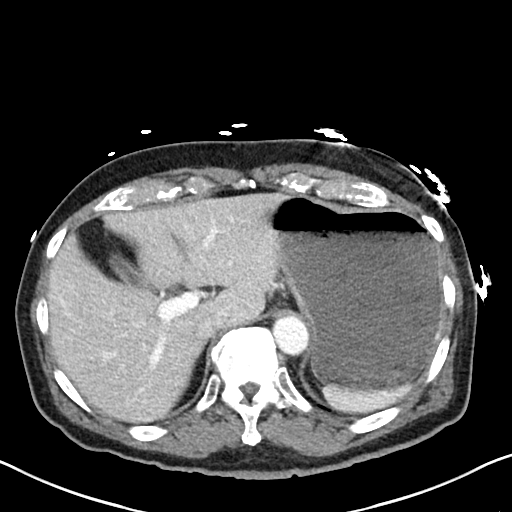
[im 85/99  soft-tissue]
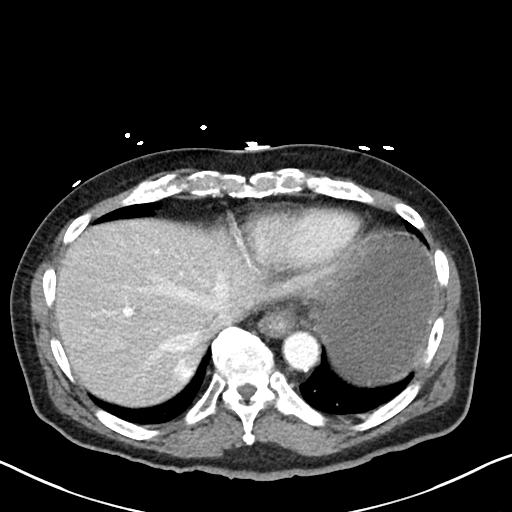
[im 92/99  soft-tissue]
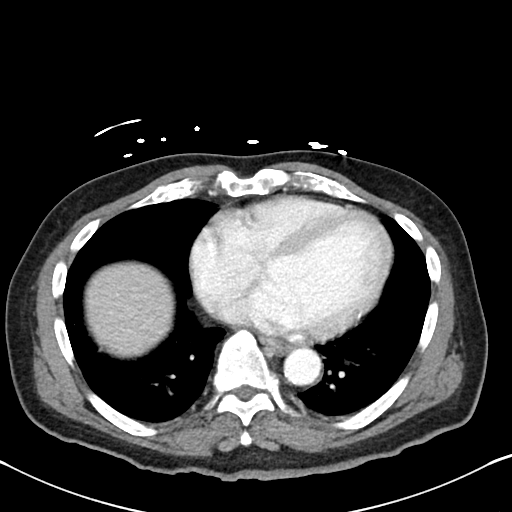

[Series 7: abdomen 3.0 mpr cor · coronal · 0.73mm/px · 3 of 87 slices shown]
[im 29/87  soft-tissue]
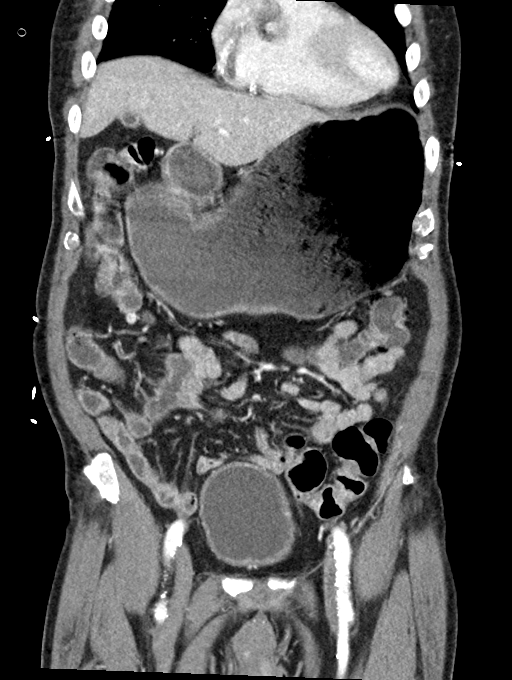
[im 39/87  soft-tissue]
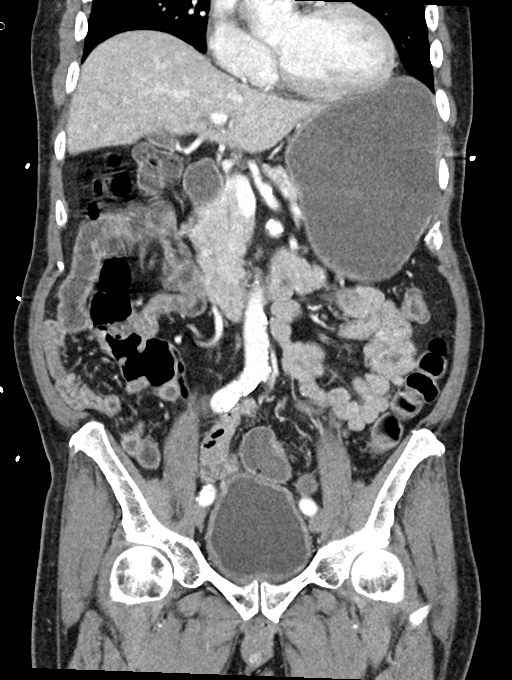
[im 48/87  soft-tissue]
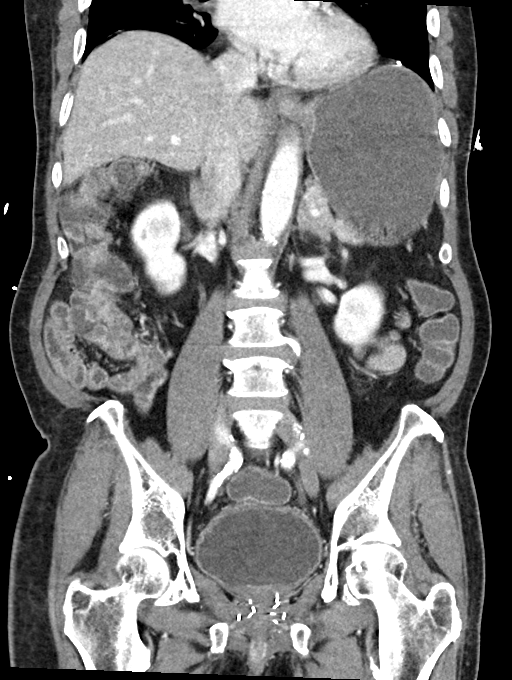

[15 of 46 positions shown; findings below may reference images not displayed]

FINDINGS: Lower chest: Mild motion artifact and atelectasis. Mild
cardiomegaly. No pericardial effusion. Calcified coronary artery
atherosclerosis.

Hepatobiliary: Contracted gallbladder. Negative liver. No bile duct
enlargement.

Pancreas: Partially atrophied, otherwise negative.

Spleen: Diminutive and negative; there is a tiny low-density area in
the mid body of the spleen on series 4, image 28 which is too small
to characterize but likely benign.

Adrenals/Urinary Tract: Normal adrenal glands. Symmetric renal
enhancement and contrast excretion. Benign appearing right renal
posterior midpole cyst. Decompressed ureters. No urinary calculus
identified.

Diffusely thick-walled urinary bladder (series 4, image 81). No
perivesical stranding. Bladder volume within normal limits.

Stomach/Bowel: Fluid throughout nondilated large bowel to the
rectum. Redundant hepatic flexure and right colon. Motion artifact
in the upper abdomen. No discrete large bowel inflammation. Normal
appendix on series 4, image 59.

Decompressed terminal ileum with fluid-filled but nondilated distal
small bowel in the lower abdomen. Decompressed proximal small bowel.

Moderately to severely dilated gas and fluid containing stomach.
Duodenal bulb similarly dilated with abrupt tapering at the distal
bulb, proximal duodenum on coronal image 43. Questionable duodenal
wall thickening there, but no perigastric or para duodenal
inflammation. Distal duodenum and ligament of Treitz decompressed.

No free air.  No free fluid.

Vascular/Lymphatic: Aortoiliac calcified atherosclerosis. Tortuous
major arteries in the abdomen and pelvis which appear to remain
patent. Portal venous system is patent. No lymphadenopathy.

Reproductive: Sequelae of prostate brachytherapy.

Other: No pelvic free fluid.

Musculoskeletal: Chronic appearing L1 superior endplate compression
fracture with associated sclerosis. Disc and endplate degeneration
elsewhere. No acute osseous abnormality identified.
IMPRESSION: 1. Gastric Outlet Obstruction: Moderately to severely dilated
stomach containing gas and fluid with abrupt tapering in the
proximal duodenum. Questionable duodenal wall thickening, such that
this might be related to inflammatory or postinflammatory stricture.
But there is no regional inflammation.
Recommend NG tube decompression.

2. Superimposed fluid throughout nondilated large bowel compatible
with diarrhea. No discrete large bowel inflammation. Normal
appendix.

3. Diffusely thick-walled urinary bladder suggesting cystitis or
UTI. Nearby sequelae of prostate brachytherapy.

4. Chronic appearing L1 superior endplate compression fracture.

5. Aortic Atherosclerosis ([id number].B). Mild cardiomegaly. Coronary
artery atherosclerosis.

## 2022-02-24 IMAGING — DX DG ABDOMEN 1V
1 series · 1 of 1 positions shown · non-contrast
Comparison: Prior study same day.

CLINICAL DATA: NG tube placement.

EXAM:
ABDOMEN - 1 VIEW

[abdomen kub]
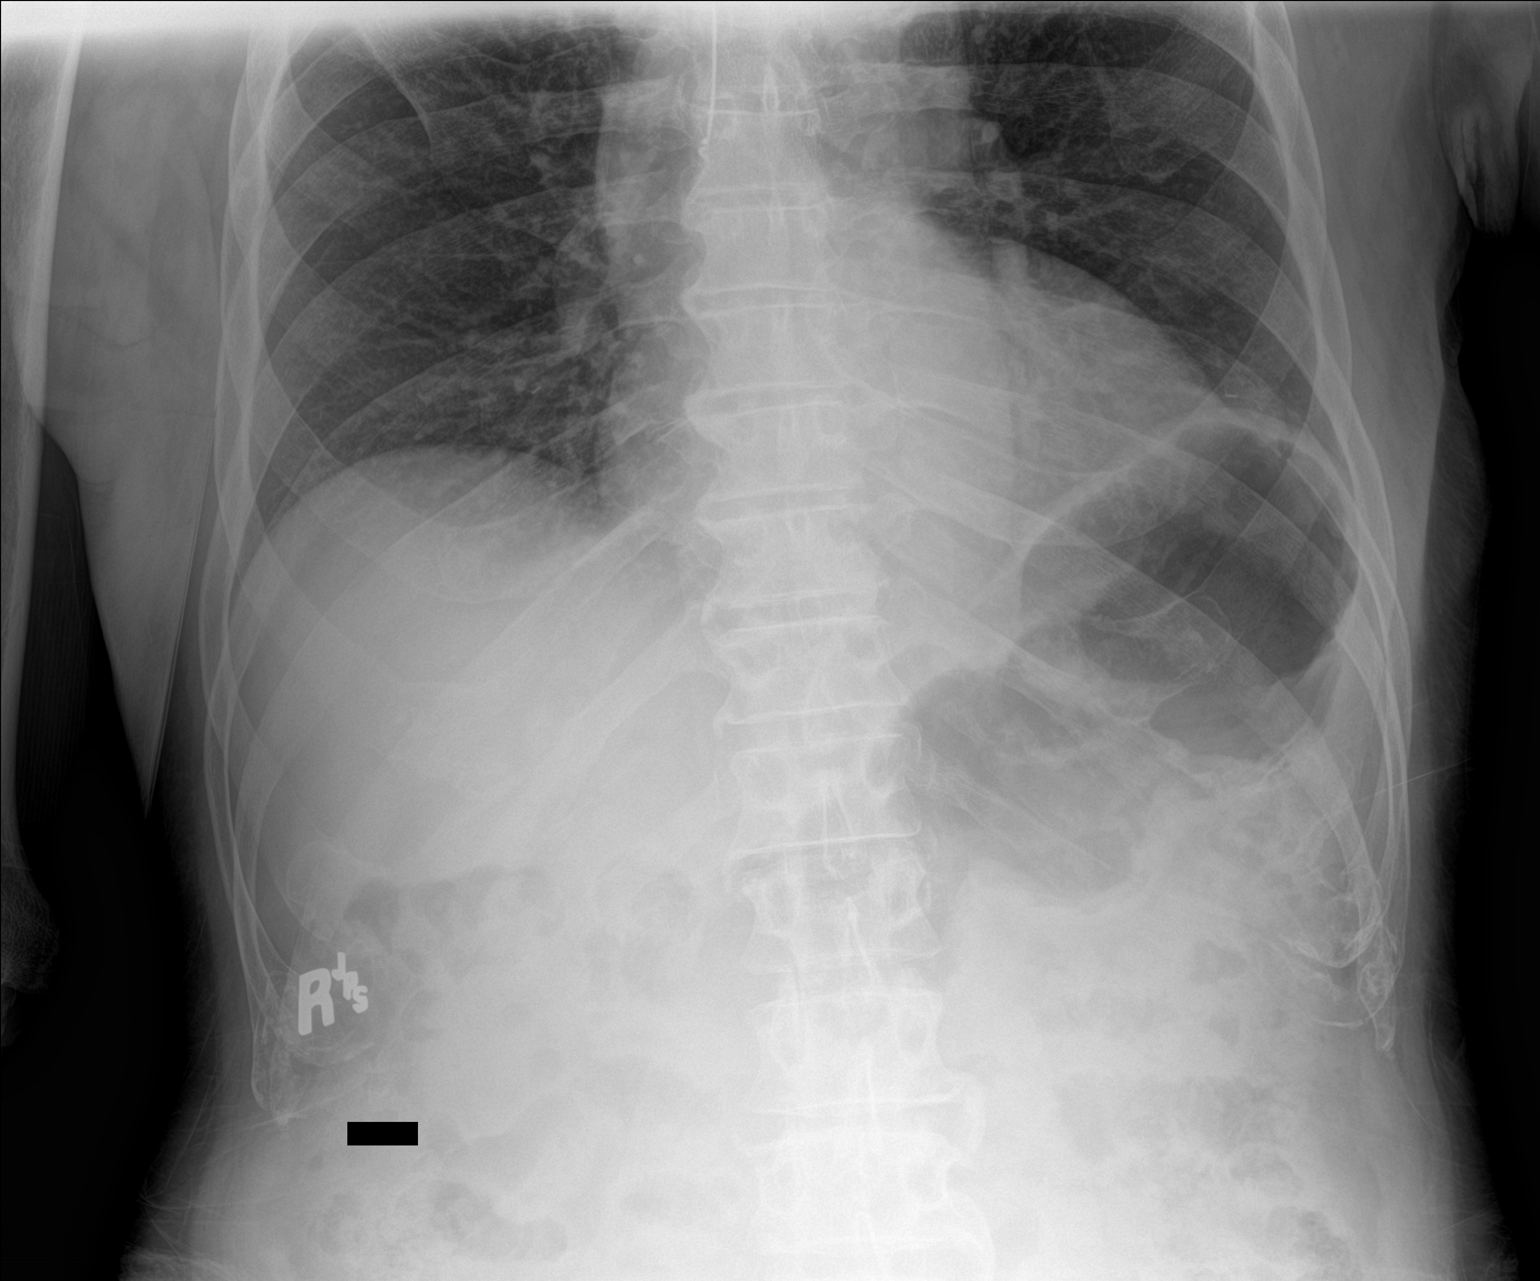

[1 of 1 positions shown; findings below may reference images not displayed]

FINDINGS: NG tube has been withdrawn and is noted with its tip over the upper
chest. NG tube tip could be and the thoracic esophagus or right
mainstem bronchus. Repositioning suggested. Nondistended air-filled
loops of small bowel noted. No free air. Degenerative changes
scoliosis thoracic spine.
IMPRESSION: NG tube has been withdrawn and is noted with its tip over the upper
chest. NG tube tip could be in the thoracic esophagus or right
mainstem bronchus. Repositioning suggested.

These results will be called to the ordering clinician or
representative by the Radiologist Assistant, and communication
documented in the PACS or [REDACTED].

## 2022-03-02 IMAGING — DX DG CHEST 1V PORT
1 series · 1 of 1 positions shown · non-contrast
Comparison: None.

CLINICAL DATA: Shortness of breath

EXAM:
PORTABLE CHEST 1 VIEW

[chest]
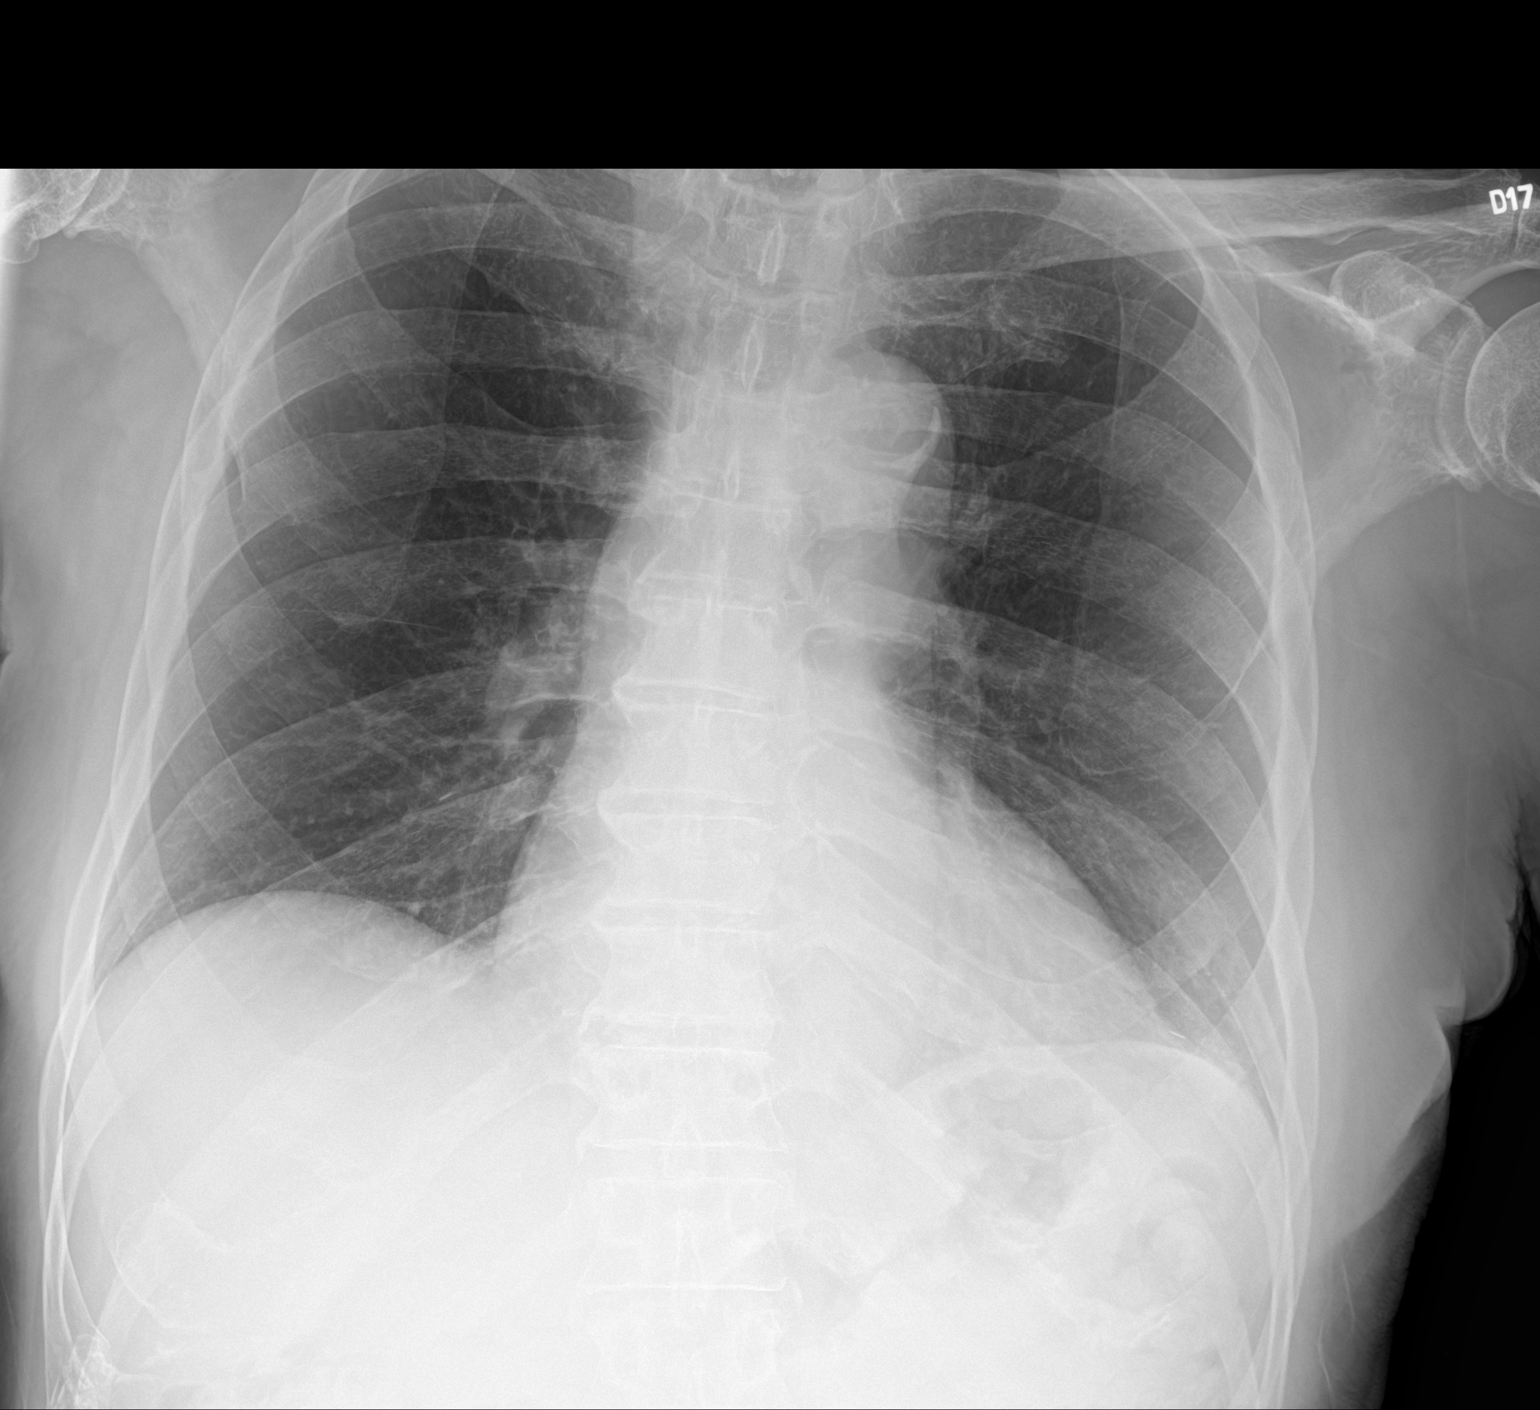

[1 of 1 positions shown; findings below may reference images not displayed]

FINDINGS: Lungs are clear. Heart is upper normal in size with pulmonary
vascularity normal. No adenopathy. There is aortic atherosclerosis.
No bone lesions.
IMPRESSION: Lungs clear. Heart upper normal in size. Aortic Atherosclerosis
([id number]).
